# Patient Record
Sex: Female | Born: 1980 | Race: White | Hispanic: No | Marital: Married | Smoking: Former smoker
Health system: Southern US, Community
[De-identification: ages and names within clinical notes are randomized; demographics above are authoritative.]

## PROBLEM LIST (undated history)

## (undated) DIAGNOSIS — E282 Polycystic ovarian syndrome: Secondary | ICD-10-CM

## (undated) HISTORY — PX: TONSILLECTOMY: SUR1361

---
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
## 2000-07-07 ENCOUNTER — Inpatient Hospital Stay (HOSPITAL_COMMUNITY): Admission: AD | Admit: 2000-07-07 | Discharge: 2000-07-07 | Payer: Self-pay | Admitting: Obstetrics

## 2002-10-20 ENCOUNTER — Encounter: Admission: RE | Admit: 2002-10-20 | Discharge: 2002-10-20 | Payer: Self-pay | Admitting: Family Medicine

## 2002-10-20 ENCOUNTER — Encounter: Payer: Self-pay | Admitting: Family Medicine

## 2003-03-20 ENCOUNTER — Other Ambulatory Visit: Admission: RE | Admit: 2003-03-20 | Discharge: 2003-03-20 | Payer: Self-pay | Admitting: Gynecology

## 2004-03-20 ENCOUNTER — Other Ambulatory Visit: Admission: RE | Admit: 2004-03-20 | Discharge: 2004-03-20 | Payer: Self-pay | Admitting: Gynecology

## 2005-04-01 ENCOUNTER — Other Ambulatory Visit: Admission: RE | Admit: 2005-04-01 | Discharge: 2005-04-01 | Payer: Self-pay | Admitting: Gynecology

## 2005-11-25 ENCOUNTER — Inpatient Hospital Stay (HOSPITAL_COMMUNITY): Admission: AD | Admit: 2005-11-25 | Discharge: 2005-11-29 | Payer: Self-pay | Admitting: Obstetrics and Gynecology

## 2006-01-07 ENCOUNTER — Other Ambulatory Visit: Admission: RE | Admit: 2006-01-07 | Discharge: 2006-01-07 | Payer: Self-pay | Admitting: Obstetrics and Gynecology

## 2007-01-07 ENCOUNTER — Ambulatory Visit: Payer: Self-pay | Admitting: Cardiology

## 2007-07-20 ENCOUNTER — Encounter: Admission: RE | Admit: 2007-07-20 | Discharge: 2007-07-20 | Payer: Self-pay | Admitting: Emergency Medicine

## 2007-11-02 ENCOUNTER — Encounter: Admission: RE | Admit: 2007-11-02 | Discharge: 2007-11-02 | Payer: Self-pay | Admitting: Obstetrics and Gynecology

## 2008-04-19 ENCOUNTER — Encounter: Admission: RE | Admit: 2008-04-19 | Discharge: 2008-04-19 | Payer: Self-pay | Admitting: Family Medicine

## 2008-05-21 ENCOUNTER — Ambulatory Visit (HOSPITAL_COMMUNITY): Admission: RE | Admit: 2008-05-21 | Discharge: 2008-05-21 | Payer: Self-pay | Admitting: Obstetrics and Gynecology

## 2008-08-02 ENCOUNTER — Ambulatory Visit (HOSPITAL_COMMUNITY): Admission: RE | Admit: 2008-08-02 | Discharge: 2008-08-02 | Payer: Self-pay | Admitting: Obstetrics and Gynecology

## 2008-12-31 ENCOUNTER — Inpatient Hospital Stay (HOSPITAL_COMMUNITY): Admission: AD | Admit: 2008-12-31 | Discharge: 2008-12-31 | Payer: Self-pay | Admitting: Obstetrics and Gynecology

## 2009-01-04 ENCOUNTER — Encounter (INDEPENDENT_AMBULATORY_CARE_PROVIDER_SITE_OTHER): Payer: Self-pay | Admitting: Obstetrics and Gynecology

## 2009-01-04 ENCOUNTER — Inpatient Hospital Stay (HOSPITAL_COMMUNITY): Admission: AD | Admit: 2009-01-04 | Discharge: 2009-01-07 | Payer: Self-pay | Admitting: Obstetrics and Gynecology

## 2010-07-01 IMAGING — US US OB COMP +14 WK
1 series · 14 of 28 positions shown · non-contrast
Comparison: none

OBSTETRICAL ULTRASOUND:
 This ultrasound was performed in The [HOSPITAL], and the [facility] report will be stored to [REDACTED] PACS.

[Series 1: us ob comp +14 wk · 14 of 120 slices shown]
[im 5/120]
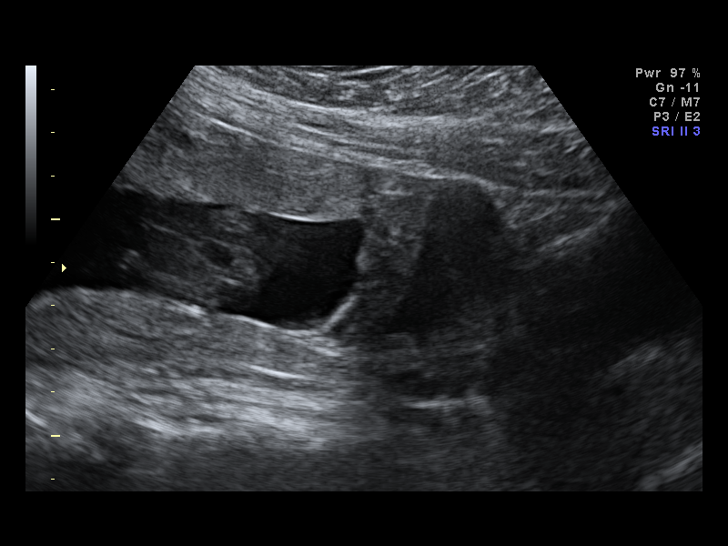
[im 14/120]
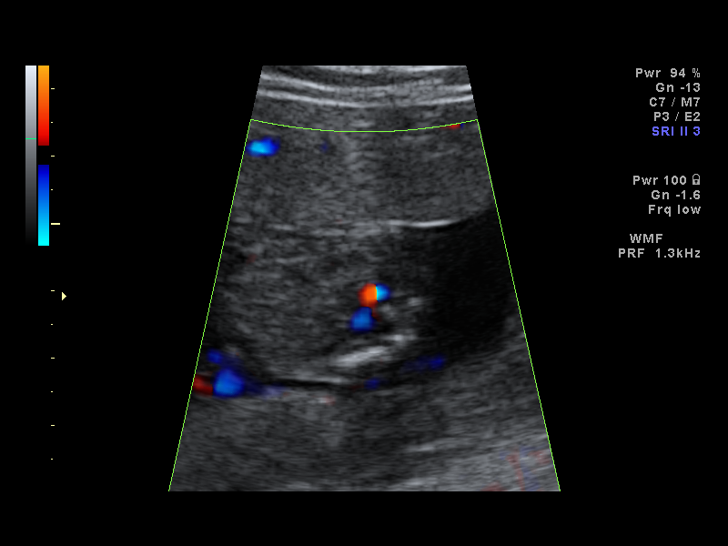
[im 23/120]
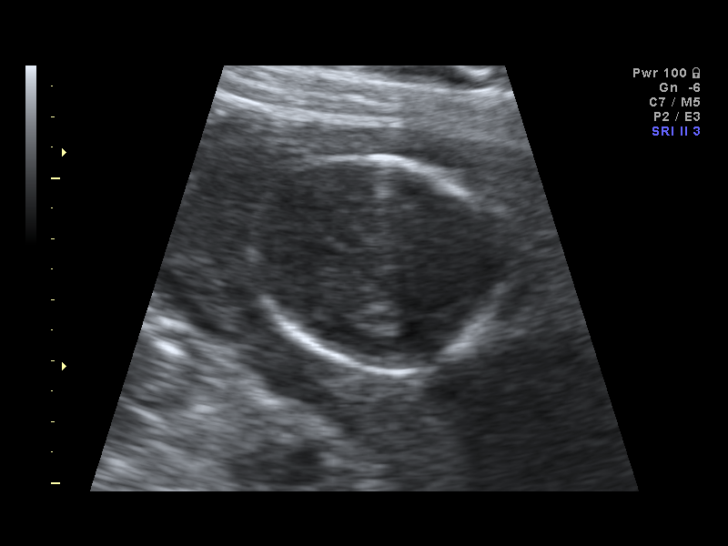
[im 31/120]
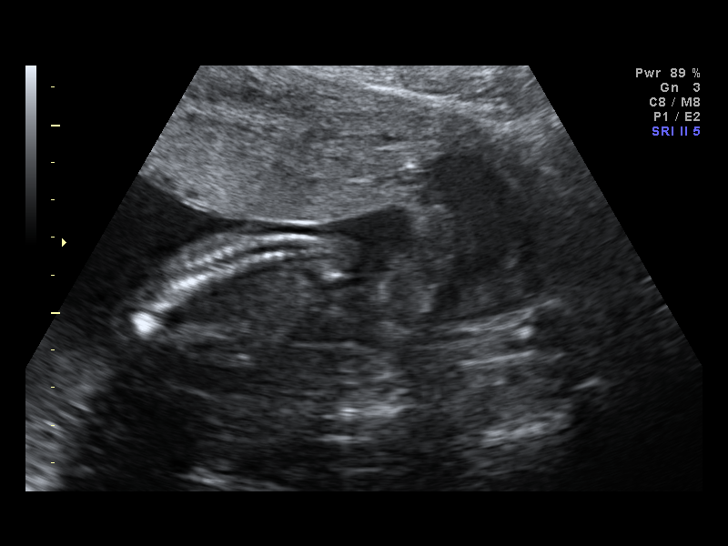
[im 40/120]
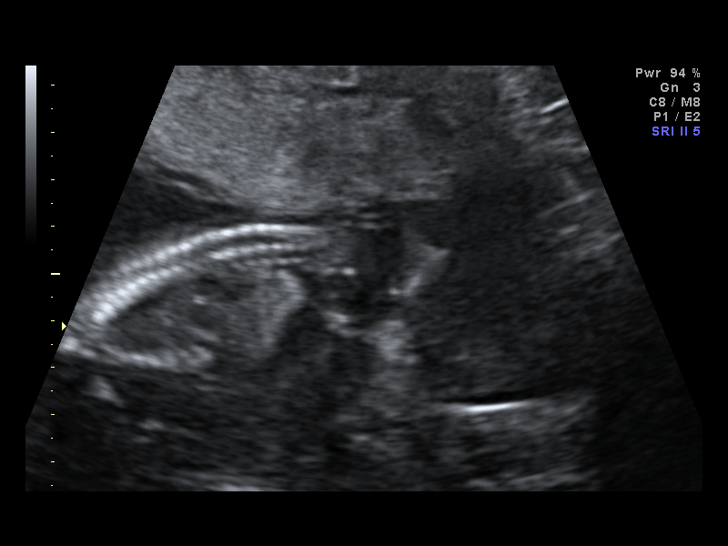
[im 49/120]
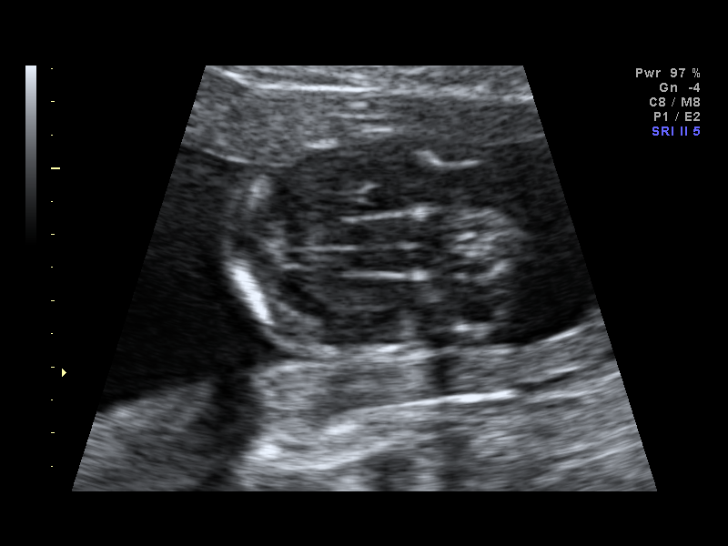
[im 58/120]
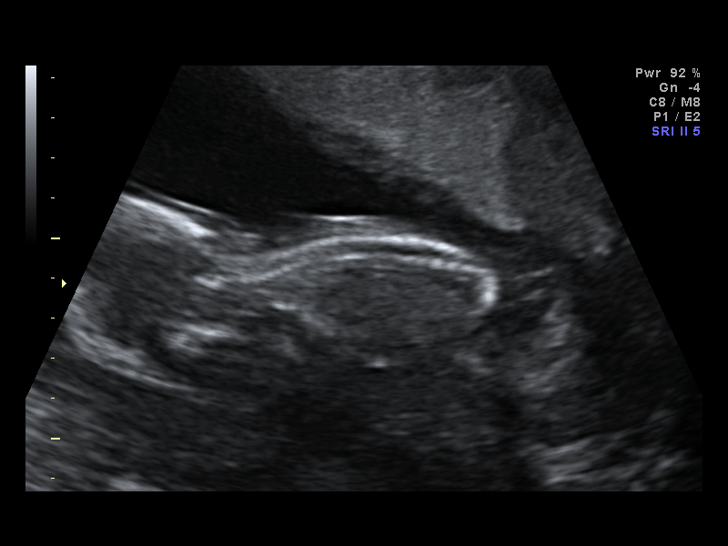
[im 67/120]
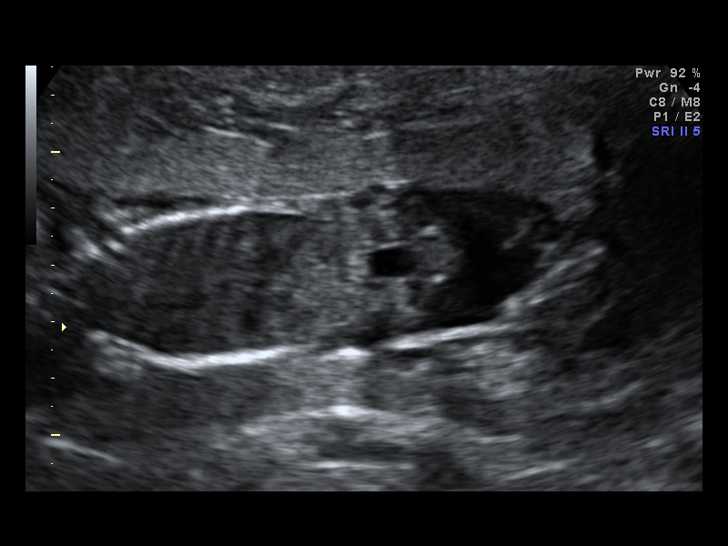
[im 75/120]
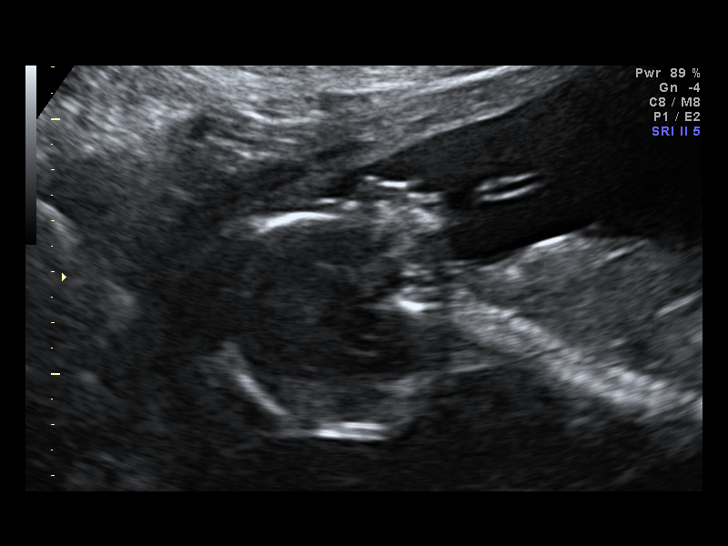
[im 84/120]
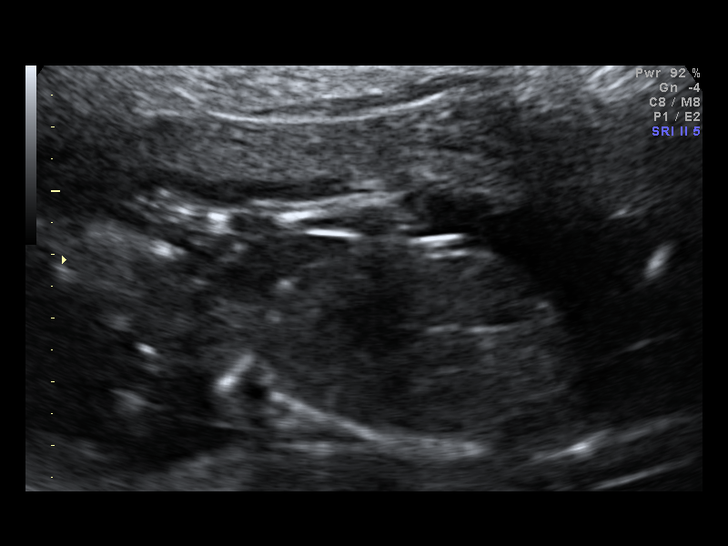
[im 93/120]
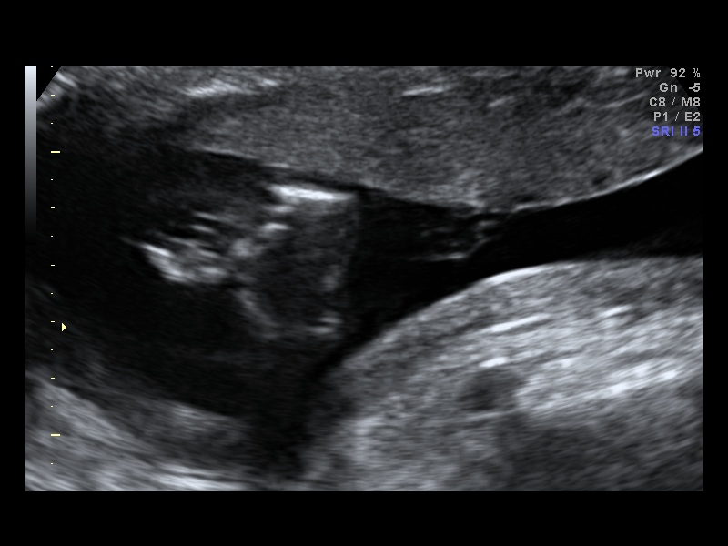
[im 102/120]
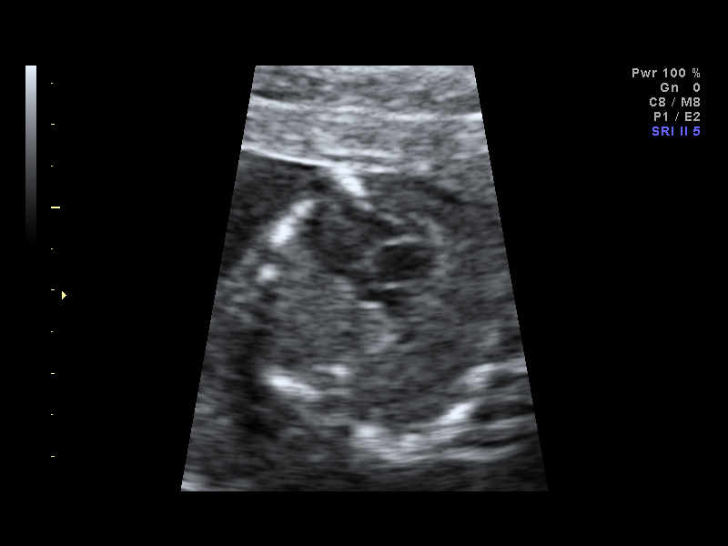
[im 111/120]
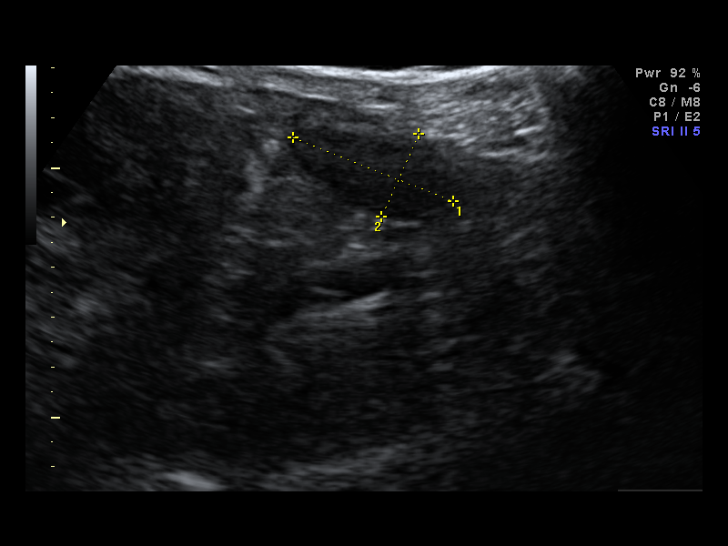
[im 120/120]
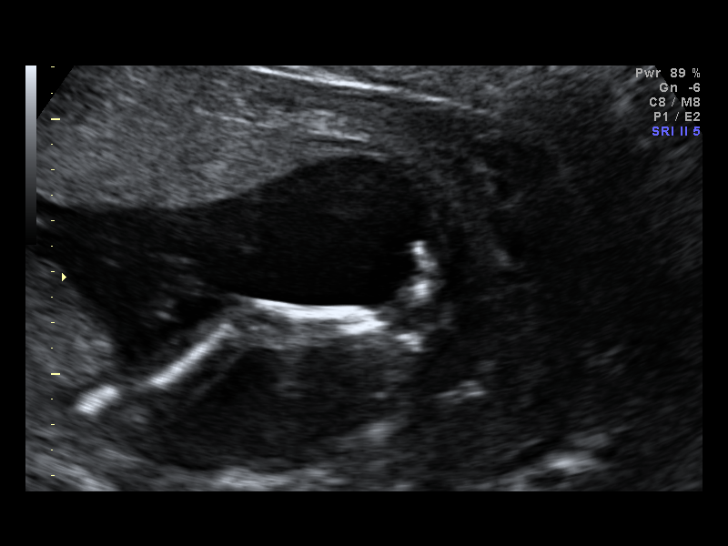

[14 of 28 positions shown; findings below may reference images not displayed]

IMPRESSION: [facility] has also been faxed to the ordering physician.

## 2011-01-07 LAB — CBC
HCT: 23.8 % — ABNORMAL LOW (ref 36.0–46.0)
HCT: 31.3 % — ABNORMAL LOW (ref 36.0–46.0)
Hemoglobin: 10.5 g/dL — ABNORMAL LOW (ref 12.0–15.0)
MCHC: 33.6 g/dL (ref 30.0–36.0)
MCHC: 33.6 g/dL (ref 30.0–36.0)
MCV: 75.6 fL — ABNORMAL LOW (ref 78.0–100.0)
MCV: 76.5 fL — ABNORMAL LOW (ref 78.0–100.0)
Platelets: 272 10*3/uL (ref 150–400)
RDW: 15.1 % (ref 11.5–15.5)

## 2011-01-07 LAB — CCBB MATERNAL DONOR DRAW

## 2011-02-10 NOTE — Discharge Summary (Signed)
Alisha Hansen, Alisha Hansen            ACCOUNT NO.:  [id number]   MEDICAL RECORD NO.:  [id number]          PATIENT TYPE:  INP   LOCATION:  9124                          FACILITY:  [facility]   PHYSICIAN:  Dineen Kid. Rana Snare, M.D.    DATE OF BIRTH:  1980-10-18   DATE OF ADMISSION:  01/04/2009  DATE OF DISCHARGE:  01/07/2009                               DISCHARGE SUMMARY   ADMITTING DIAGNOSES:  1. Intrauterine pregnancy at 38-6/7th weeks estimated gestational age.  2. Previous cesarean section, desires repeat.  3. Spontaneous onset of labor.   DISCHARGE DIAGNOSES:  1. Status post low transverse cesarean section.  2. A viable female infant.   PROCEDURES:  1. Repeat low transverse cesarean section.  2. Resection of vulvar nevus.   REASON FOR ADMISSION:  Please see written H and P.   HOSPITAL COURSE:  The patient is a 30 year old, white, married female,  gravida 2, para 1, who was admitted to [facility] at 63-  6/7th weeks estimated gestational age in labor.  The patient had had a  previous cesarean section, desired repeat.  The patient was then  transferred to the operating room where spinal anesthesia was  administered without difficulty.  A low-transverse incision was made  with delivery of a viable female infant weighing 9 pounds 6 ounces with  Apgars of 8 at one and 9 at five minutes.  The patient tolerated the  procedure well and taken to the recovery room in stable condition.  On  postoperative day #1, the patient was without complaint.  Vital signs  remained stable.  She was afebrile.  Fundus firm and nontender.  Abdominal dressing noted to be clean, dry, and intact.  Laboratory  findings showed hemoglobin of 8.0.  On postoperative day #2, the patient  was without complaint.  Vital signs were stable.  She was afebrile.  Fundus firm and nontender.  Abdominal dressing had been removed  revealing an incision that was clean, dry, and intact.  On postoperative  day #3, the  patient was without complaint.  Vital signs were stable.  She was afebrile.  Fundus firm and nontender.  Incision was clean, dry,  and intact.  Staples removed.  The patient later discharged home.   CONDITION ON DISCHARGE:  Stable.   DIET:  Regular as tolerated.   ACTIVITY:  No heavy lifting, no driving x2 weeks, no vaginal entry.   FOLLOWUP:  The patient is to follow up in the office in 1-2 weeks for an  incision check.  She is to call for temperature greater than 100  degrees, persistent nausea, vomiting, heavy vaginal bleeding, and/or  redness or drainage from the incisional site.   DISCHARGE MEDICATIONS:  1. Tylox #30 one p.o. 4-6 hours p.r.n.  2. Motrin 600 mg every 6 hours.  3. Prenatal vitamins 1 p.o. daily.  4. Colace 1 p.o. daily p.r.n.      Julio Sicks, N.P.      Dineen Kid Rana Snare, M.D.  Electronically Signed    CC/MEDQ  D:  01/07/2009  T:  01/08/2009  Job:  [id number]

## 2011-02-10 NOTE — Op Note (Signed)
Alisha Hansen, Alisha Hansen            ACCOUNT NO.:  [id number]   MEDICAL RECORD NO.:  [id number]          PATIENT TYPE:  INP   LOCATION:  9124                          FACILITY:  [facility]   PHYSICIAN:  Guy Sandifer. Henderson Cloud, M.D. DATE OF BIRTH:  [birth year]   DATE OF PROCEDURE:  01/04/2009  DATE OF DISCHARGE:                               OPERATIVE REPORT   PREOPERATIVE DIAGNOSES:  1. Previous cesarean section, desires repeat.  2. Labor.   POSTOPERATIVE DIAGNOSES:  1. Previous cesarean section, desires repeat.  2. Labor.   PROCEDURE:  1. Repeat low transverse cesarean section.  2. Resection of vulvar nevus.   SURGEON:  Guy Sandifer. Henderson Cloud, M.D.   ANESTHESIA:  Spinal.   SPECIMENS:  1. Placenta.  2. Vulvar nevus sent off to pathology.   FINDINGS:  Viable female infant, Apgars of 8 and 9 at one and five minutes  respectively.  Birth weight 9 pounds 8 ounces.  Arterial cord pH 7.17.  Estimated blood loss 800 cc.   INDICATIONS AND CONSENT:  This patient is a 30 year old married white  female G2, P30, EDC of January 12, 2009.  Prenatal care has been  complicated by previous cesarean section.  The patient desires repeat.  The patient presents with regular uterine contractions every 3 minutes.  Cesarean section was discussed with the patient and her husband.  Potential risks and complications were reviewed preoperatively including  but limited to infection, organ damage, bleeding requiring transfusion  of blood products with HIV and hepatitis acquisition, DVT, PE,  pneumonia.  All questions were answered and consent was signed on the  chart.  The patient also requests resection of vulvar nevus.  Pain and  dyspareunia following this have been reviewed.   PROCEDURE:  The patient is taken to the operating room where she was  identified, spinal anesthetic was placed and she was placed in the  dorsal supine position with a 15 degree left lateral wedge.  She was  then prepped.  Foley catheter was  placed, the bladder was drained and  she was draped in a sterile fashion.  Time-out was carried out.  After  testing for adequate spinal anesthesia, skin is entered through the  Pfannenstiel scar and resection of the old scar and the vein was done.  Dissection was carried out to the peritoneum which was incised and  extended superiorly and inferiorly.  Vesicouterine peritoneum was taken  down cephalad laterally.  The bladder flap was developed.  The bladder  blade was placed.  Uterus was incised in a low transverse manner.  The  uterine cavity was entered bluntly with a hemostat.  Uterine incision  was extended cephalad laterally with fingers.  Artificial rupture of  membranes for copious clear fluid was carried out.  Vacuum extractor was  used to elevate the vertex from the incision.  This was done with a  single gentle extraction with no pop offs.  The vacuum extractor was  removed.  Oral and nasal pharynx were suctioned.  Nuchal cord x1 was  reduced.  Remainder of the baby was delivered.  Good cry and tone was  noted.  Cord was clamped and cut.  The baby was handed to the waiting  pediatrics team.  Placenta was manually delivered and sent to pathology.  Uterine cavity was clean.  Uterus was closed in a running locking  fashion of 2 imbricating layers of 0 Monocryl suture.  Additional figure-  of-eight of 0 Monocryl was placed on the center of the incision to  obtain complete hemostasis.  Careful observation reveals good  hemostasis.  Ovaries palpated normally bilaterally.  Anterior peritoneum  was closed in a running fashion with 0 Monocryl suture which was also  used to reapproximate the  pyramidalis muscle in the midline.  Anterior  rectus fascia is closed in a running fashion with the double looped 0  PDS suture.  Subcutaneous layers closed with interrupted 2-0 plain  sutures and the skin was closed with clips.  All sponge, instrument, and  needle counts were correct and the patient  was transferred to the  recovery room in stable condition.      Guy Sandifer Henderson Cloud, M.D.  Electronically Signed     JET/MEDQ  D:  01/04/2009  T:  01/05/2009  Job:  [id number]

## 2011-02-13 NOTE — Discharge Summary (Signed)
Alisha Hansen, Alisha Hansen            ACCOUNT NO.:  [id number]   MEDICAL RECORD NO.:  [id number]          PATIENT TYPE:  INP   LOCATION:  9123                          [facility]ACILITY:  [facility]   PHYSICIAN:  Freddy Finner, M.D.   DATE OF BIRTH:  14-Jun-1981   DATE OF ADMISSION:  11/25/2005  DATE OF DISCHARGE:  11/29/2005                                 DISCHARGE SUMMARY   ADMITTING DIAGNOSIS:  1.  Intrauterine pregnancy at 28 weeks estimated gestational age.  2.  Two-stage induction of labor secondary to large-for-gestational age      infant.   DISCHARGE DIAGNOSIS:  1.  Status post low transverse cesarean section secondary to failure to      progress.  2.  Viable female infant.   PROCEDURE:  Primary low transverse cesarean section.   REASON FOR ADMISSION:  Please see written H&P.   HOSPITAL COURSE:  Patient was a 30 year old primigravida that was admitted  to [facility] at 39 weeks estimated gestational age for a  two-stage induction due to large-for-gestational age infant.  Patient did  progress to 3 cm dilated, 50% effaced, vertex at -3 station.  Despite  adequate labor for greater than four hours patient made no further cervical  change beyond 3 cm dilatation.  Decision was made to proceed with a low  transverse cesarean section.  Patient was then transferred to the operating  room where epidural was dosed to an adequate surgical level.  A low  transverse incision was made with delivery of a viable female infant  weighing 8 pounds 0 ounces with Apgars of 9 at one minute, 10 at five  minutes.  The patient tolerated procedure well and was taken to the recovery  room in stable condition.  On postoperative day #1 patient was without  complaint.  Vital signs were stable.  She was afebrile.  Abdomen soft.  Fundus firm and nontender.  Abdominal dressing was noted to have a scant  amount of drainage.  On postoperative day #2 patient was doing well.  Vital  signs were stable.   She was afebrile.  Fundus firm and nontender.  Abdominal  dressing had been removed revealing an incision that was clean, dry, and  intact.  Laboratory findings revealed hemoglobin of 10.1.  On postoperative  day #3 patient was without complaint.  Vital signs remained stable.  She was  afebrile.  Fundus firm and nontender.  Incision was clean, dry, and intact.  Patient is ambulating well, tolerating a regular diet without complaints of  nausea and vomiting.  Staples were removed.  Steri-Strips were applied and  discharge instructions reviewed and patient was later discharged home.   CONDITION ON DISCHARGE:  Stable.   DIET:  Regular, as tolerated.   ACTIVITY:  No heavy lifting, no driving x2 weeks, no vaginal entry.   FOLLOW-UP:  Patient to follow up in the office in one to two weeks for an  incision check.  She is to call for temperature greater than 100 degrees,  persistent nausea, vomiting, heavy vaginal bleeding and/or redness or  drainage from the incisional site.  DISCHARGE MEDICATIONS:  1.  Percocet 5/325 #30 one p.o. every four to six hours p.r.n.  2.  Motrin 600 mg every six hours.  3.  Prenatal vitamins one p.o. daily.  4.  Colace one p.o. daily p.r.n.      Julio Sicks, N.P.      Freddy Finner, M.D.  Electronically Signed    CC/MEDQ  D:  01/05/2006  T:  01/05/2006  Job:  [id number]

## 2011-02-13 NOTE — Op Note (Signed)
NAMEXITLALY, AULT            ACCOUNT NO.:  [id number]   MEDICAL RECORD NO.:  [id number]          PATIENT TYPE:  INP   LOCATION:  9164                          [facility]ACILITY[facility]   PHYSICIAN:  Dineen Kid. Rana Snare, M.D.    DATE OF BIRTH:  05-13-1981   DATE OF PROCEDURE:  11/26/2005  DATE OF DISCHARGE:                                 OPERATIVE REPORT   PREOPERATIVE DIAGNOSES:  1.  Intrauterine pregnancy at 39 weeks.  2.  Failure to progress.   POSTOPERATIVE DIAGNOSES:  1.  Intrauterine pregnancy at 39 weeks.  2.  Failure to progress.   OPERATION/PROCEDURE:  Primary low segment transverse cesarean section.   SURGEON:  Dineen Kid. Rana Snare, M.D.   ANESTHESIA:  Epidural.   INDICATIONS:  Alisha Hansen is a 30 year old who presented at 39 weeks for two-  stage induction due to large-for-gestational-age infant.  She progressed to  3 cm, 50% effaced, fetal vertex 50% into the pelvis adequately, but despite  adequate labor for greater than four hours, no fetal cervical change beyond  3 cm, 50% was made and we proceeded with primary low segment transverse  cesarean section for failure to progress.   FINDINGS AT THE TIME OF SURGERY:  Viable female infant, Apgars 9 and 10,  weight is 8 pounds 0 ounces.   DESCRIPTION OF PROCEDURE:  After adequate analgesia, the patient was placed  in the supine position, left lateral tilt.  She was sterilely prepped and  draped.  Bladder was sterilely drained with a Foley catheter.  A  Pfannenstiel skin incision was made two fingerbreadths above the pubic  symphysis taken down sharply to the fascia which was incised transversely,  extended superiorly and inferiorly off the bellies of the rectus muscle  which were separated sharply in the midline.  Peritoneum is entered sharply,  bladder flap was created, placed behind the bladder blade.  A low segment  myotomy incision was made down to the infant's vertex, extended laterally  with operator's fingertips. The infant's  vertex was delivered  atraumatically.  Nares and pharynx suctioned.  Infant was then delivered,  cord was clamped and cut, and infant was handed to the pediatricians for  resuscitation.  Placenta was extracted manually.  Uterus was exteriorized,  wiped clean with a dry lap.  The myotomy incision was closed in two layers,  the first layer being a running locking layer, the second being the  imbricating layer with 0 Monocryl suture.  Uterus was placed back into the  peritoneal cavity, and after a copious amount of irrigation, adequate  hemostasis was assured.  The peritoneum was closed with a 0 Monocryl.  Rectus muscle plicated in the midline.  Irrigation was applied and after  adequate hemostasis, the fascia was then closed with a #1 PDS in a running  fashion.  Irrigation was  applied and after adequate hemostasis, the skin was then stapled.  Steri-  Strips applied.  The patient tolerated the procedure well and was stable on  transfer to the recovery room.  Sponge and instrument count was normal x3.  The estimated blood loss was 600 mL.  The patient received 1 g of Rocephin  after delivery of the placenta.      Dineen Kid Rana Snare, M.D.  Electronically Signed     DCL/MEDQ  D:  11/26/2005  T:  11/27/2005  Job:  [id number]

## 2011-02-13 NOTE — Assessment & Plan Note (Signed)
[facility]                            CARDIOLOGY OFFICE NOTE   NAME:RIEDELCliffie, Gingras                     MRN:          [id number]  DATE:01/07/2007                            DOB:          1981-08-03    I was asked by Dr. Thressa Sheller to evaluate Alisha Hansen, a  delightful 30 year old married white female with cardiac risk factors  including mixed hyperlipidemia.   She walks about 3 to 5 hours a week.  She does get a little short of  breath when she pushes it too hard.  She had 1 episode of chest cramping  that was fairly brief.  She has had no other symptoms of ischemia or any  other symptoms to suggest ischemia.   Her cardiac risk factors include obesity, tobacco use, which she quit  March 31, 2006 when she found out she was pregnant with her 1st child, who  is now less than 55 year old.  She has mixed hyperlipidemia with a total  cholesterol of 227, triglycerides of 167, HDL of 40, LDL of 154, VLDL of  33.   There is no premature history of coronary disease.  She does not have  hypertension or diabetes.  Her father is diabetic, however, and is  overweight.  We talked about this at length.   PAST MEDICAL HISTORY:  SHE HAS NO KNOWN DRUG ALLERGIES.   She is on once-a-day multivitamins.   She does not smoke.  She does have about 6 alcoholic beverages per  weekend.  She drinks a couple of cups of coffee a day.   She has had a C-section, tonsillectomy, and wisdom tooth extraction.   FAMILY HISTORY:  As above.   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  She is married and has 1  child, as mentioned above.   REVIEW OF SYSTEMS:  Negative for orthopnea, PND, peripheral edema.  She  has had no tachy palpitations.  There is no history of syncope.   EXAMINATION:  Her blood pressure is 106/72.  Her pulse is 78 and  regular.  She is 5 feet 2, weighs 210.  HEENT:   Normocephalic and atraumatic.  PERRLA.  Extraocular movements  intact.  Sclerae are clear.  Facial symmetry is normal.  Dentition is  satisfactory.  NECK:  Supple.  Carotid upstrokes are equal bilaterally without bruits.  There is no JVD.  Thyroid is not enlarged.  Trachea is midline.  LUNGS:  Clear to auscultation.  HEART:  Reveals a poorly appreciated PMI.  She has normal S1 and S2.  There is no gallop.  ABDOMINAL EXAM:  Soft with good bowel sounds.  Organomegaly could not be  assessed.  EXTREMITIES:  No cyanosis, clubbing, or edema.  Pulses are present.  NEUROLOGIC EXAM:  Intact.   EKG is normal.   ASSESSMENT AND PLAN:  I spent about 20 minutes plus talking to Ms.  Secrist about cardiovascular risk factor modification and TLC, or  therapeutic lifestyle changes.  I made the following recommendations:  1. Continue  walking 3 to 5 hours per week, as she is doing.  This will      lower her cardiovascular risk by 15% to 20% per year.  It will also      neutralize the obesity factor based on recent studies.  2. Try to maintain her weight, certainly not gain more, and maybe lose      more.  3. Do not restart smoking.  4. Annual blood work including fasting blood sugar and lipids.   I have not recommended any pharmacological therapy.  Her youth is  forgiving, as I told her today.  This may change down the road.     Thomas C. Daleen Squibb, MD, [facility]  Electronically Signed    TCW/MedQ  DD: 01/07/2007  DT: 01/07/2007  Job #: [id number]   cc:   Guy Sandifer. Henderson Cloud, M.D.

## 2011-08-04 ENCOUNTER — Other Ambulatory Visit: Payer: Self-pay | Admitting: Gynecology

## 2012-11-01 ENCOUNTER — Other Ambulatory Visit: Payer: Self-pay | Admitting: Gynecology

## 2015-02-13 DIAGNOSIS — E66812 Obesity, class 2: Secondary | ICD-10-CM | POA: Insufficient documentation

## 2015-02-13 DIAGNOSIS — E282 Polycystic ovarian syndrome: Secondary | ICD-10-CM | POA: Insufficient documentation

## 2017-02-05 ENCOUNTER — Ambulatory Visit (INDEPENDENT_AMBULATORY_CARE_PROVIDER_SITE_OTHER): Payer: Self-pay

## 2017-02-05 ENCOUNTER — Ambulatory Visit (HOSPITAL_COMMUNITY)
Admission: EM | Admit: 2017-02-05 | Discharge: 2017-02-05 | Disposition: A | Discharge status: Home / Self Care | Payer: Self-pay | Attending: Internal Medicine | Admitting: Internal Medicine

## 2017-02-05 ENCOUNTER — Encounter (HOSPITAL_COMMUNITY): Payer: Self-pay | Admitting: Emergency Medicine

## 2017-02-05 DIAGNOSIS — S93401A Sprain of unspecified ligament of right ankle, initial encounter: Secondary | ICD-10-CM

## 2017-02-05 DIAGNOSIS — M25571 Pain in right ankle and joints of right foot: Secondary | ICD-10-CM

## 2017-02-05 HISTORY — DX: Polycystic ovarian syndrome: E28.2

## 2017-02-05 MED ORDER — IBUPROFEN 800 MG PO TABS: 800.0000 mg | ORAL_TABLET | Freq: Three times a day (TID) | ORAL | 0 refills | Status: DC

## 2017-02-05 NOTE — Discharge Instructions (Signed)
No fracture, or dislocation on x-ray. Most likely a sprain, I recommend rest, ice 15 minutes at a time up to 4 times a day, compression of the joint through use of Ace bandages, and elevation whenever possible. For pain, prescribed ibuprofen 800 mg, take one tablet every 8 hours. If pain persists past 2 weeks, follow up with an orthopedist.

## 2017-02-05 NOTE — ED Provider Notes (Signed)
CSN: [id number]     Arrival date & time 02/05/17  1013 History   First MD Initiated Contact with Patient 02/05/17 1129     Chief Complaint  Patient presents with  . Ankle Pain   (Consider location/radiation/quality/duration/timing/severity/associated sxs/prior Treatment) 36 year old female presents to clinic for evaluation of right ankle pain. She reports that 3 days ago she stepped off the curb causing her to fall and "rolled her ankle" stay for first 2 days she had significant pain with walking, however has improved some. States she has bruising, swelling around the ankle, and a "clicking" sensation when she walks. She has tried over-the-counter therapies with some relief and wrap the ankle. Pain does not radiate, she has no past history that is significant.   The history is provided by the patient.  Ankle Pain  Associated symptoms: no back pain     Past Medical History:  Diagnosis Date  . PCOS (polycystic ovarian syndrome)    History reviewed. No pertinent surgical history. History reviewed. No pertinent family history. Social History  Substance Use Topics  . Smoking status: Never Smoker  . Smokeless tobacco: Never Used  . Alcohol use Yes   OB History    No data available     Review of Systems  Constitutional: Negative.   Respiratory: Negative.   Cardiovascular: Negative.   Gastrointestinal: Negative.   Musculoskeletal: Positive for joint swelling. Negative for back pain.  Skin: Positive for color change.  Neurological: Negative.     Allergies  Patient has no known allergies.  Home Medications   Prior to Admission medications   Not on File   Meds Ordered and Administered this Visit  Medications - No data to display  BP 115/63 (BP Location: Right Arm)   Pulse 65   Temp 98.1 F (36.7 C) (Oral)   Resp 18   LMP 01/19/2017 (Approximate)   SpO2 100%  No data found.   Physical Exam  Constitutional: She is oriented to person, place, and time. She appears  well-developed and well-nourished. No distress.  HENT:  Head: Normocephalic and atraumatic.  Right Ear: External ear normal.  Left Ear: External ear normal.  Musculoskeletal: She exhibits tenderness. She exhibits no deformity.  Area of bruising on the lateral side of the foot over the metatarsals, swelling and tenderness over the medial malleolus, no pain or tenderness along the Achilles tendon. There is pain with rotation, flexion, and extension. Capillary refill less than 2 seconds, sensory function, and motor function remains intact.  Neurological: She is alert and oriented to person, place, and time.  Skin: Skin is warm and dry. Capillary refill takes less than 2 seconds. No rash noted. She is not diaphoretic. No erythema.  Psychiatric: She has a normal mood and affect. Her behavior is normal.  Nursing note and vitals reviewed.   Urgent Care Course     Procedures (including critical care time)  Labs Review Labs Reviewed - No data to display  Imaging Review Dg Ankle Complete Right  Result Date: 02/05/2017 CLINICAL DATA:  Stepped off curb yesterday twisting the ankle. Medial pain and swelling. EXAM: RIGHT ANKLE - COMPLETE 3+ VIEW COMPARISON:  None. FINDINGS: Pronounced medial soft tissue swelling. No evidence of fracture or dislocation. IMPRESSION: Pronounced medial soft tissue swelling. Electronically Signed   By: Paulina Fusi M.D.   On: 02/05/2017 11:51       MDM   1. Sprain of left ankle, unspecified ligament, initial encounter    No fracture or dislocation on x-ray,  most likely sprain, provided counseling on rice, given prescription for ibuprofen, recommend following up with orthopedics if pain persists past 2 weeks.    Dorena BodoKennard, Naureen Benton, NP 02/05/17 1214

## 2017-02-05 NOTE — ED Triage Notes (Signed)
[facility] a [facility] of right ankle pain and swelling secondary to stepping off of a curb 1 week ago.

## 2019-01-04 IMAGING — DX DG ANKLE COMPLETE 3+V*R*
3 series · 3 of 3 positions shown · non-contrast
Comparison: None.

CLINICAL DATA: Stepped off curb yesterday twisting the ankle.
Medial pain and swelling.

EXAM:
RIGHT ANKLE - COMPLETE 3+ VIEW

[ankle ap]
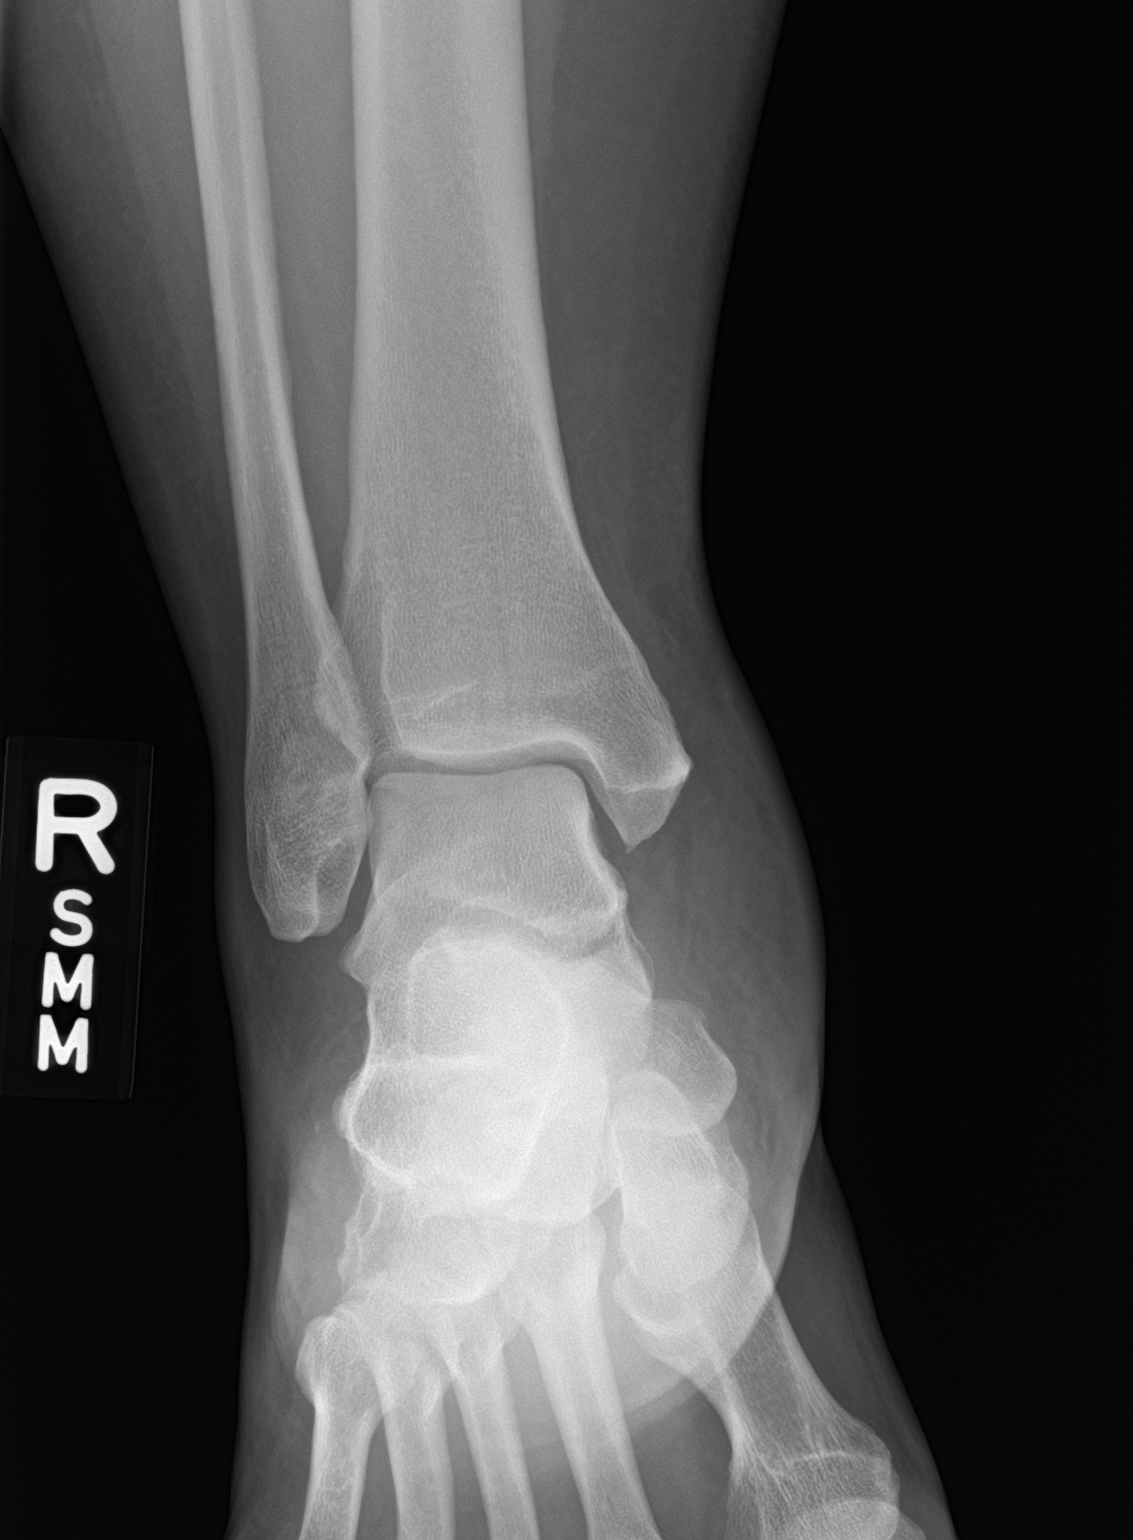

[ankle obl]
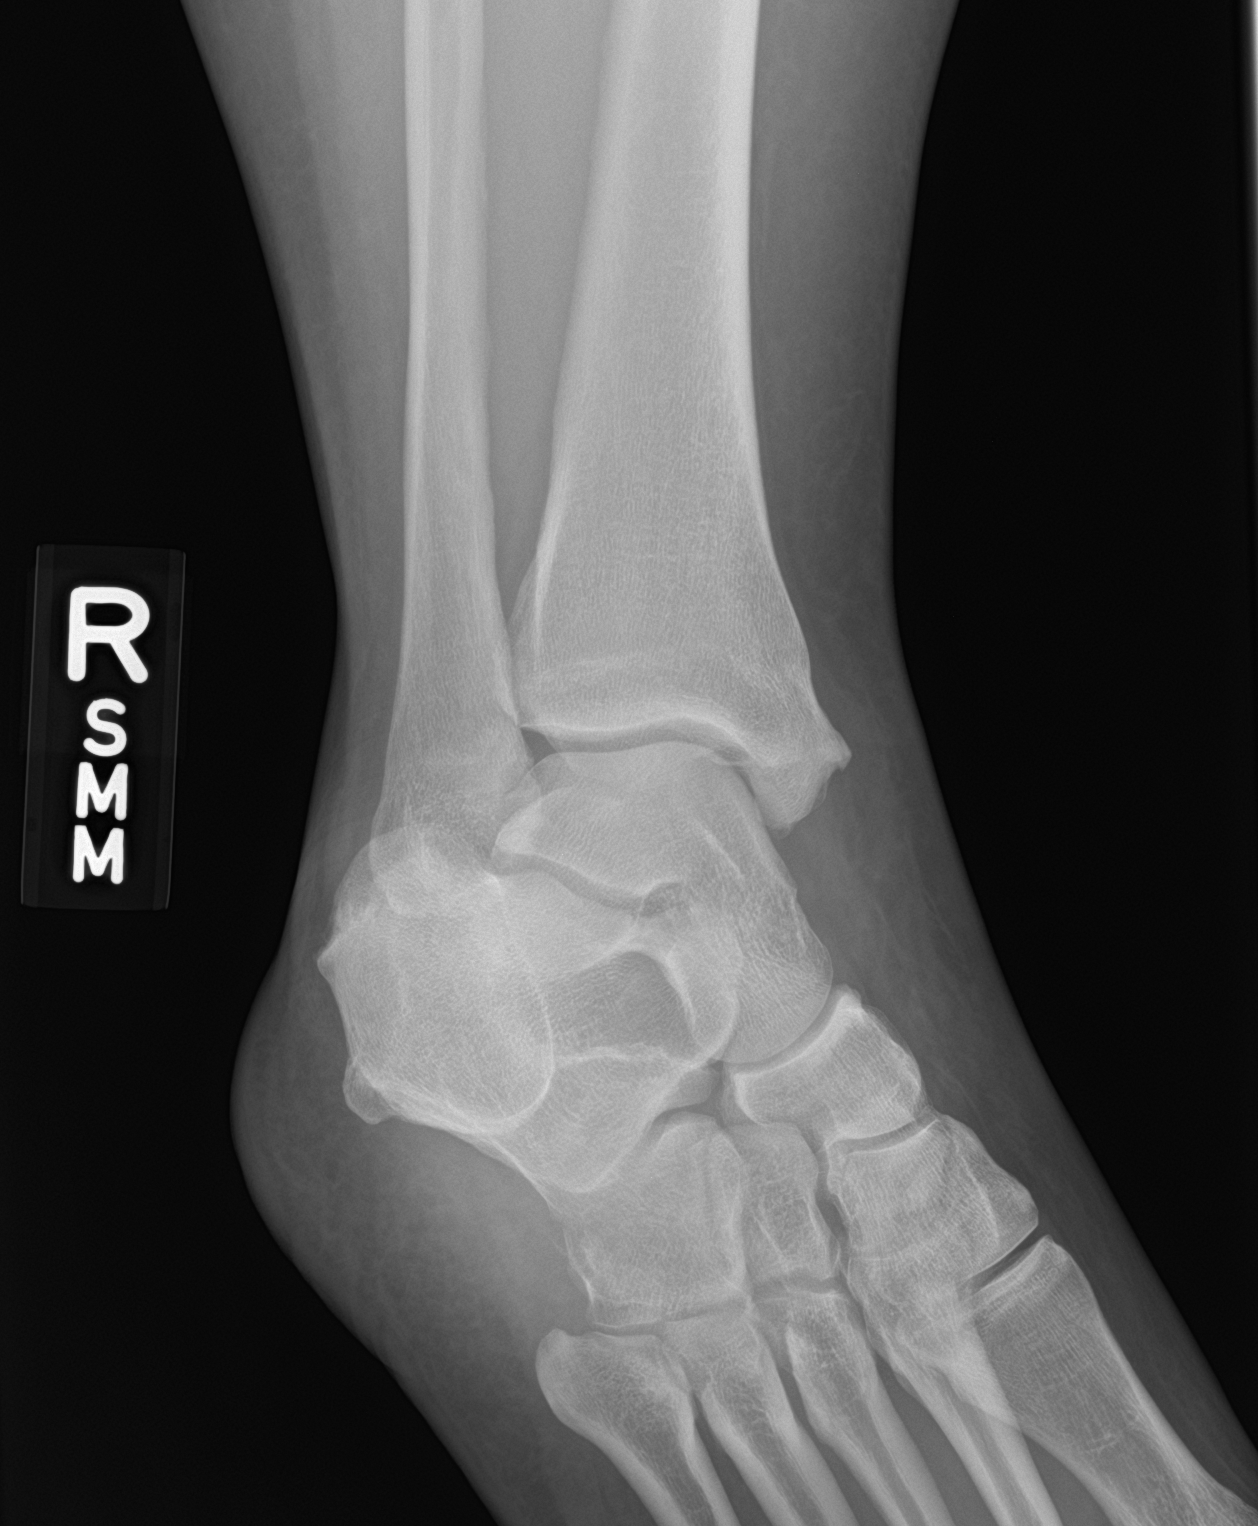

[ankle lat]
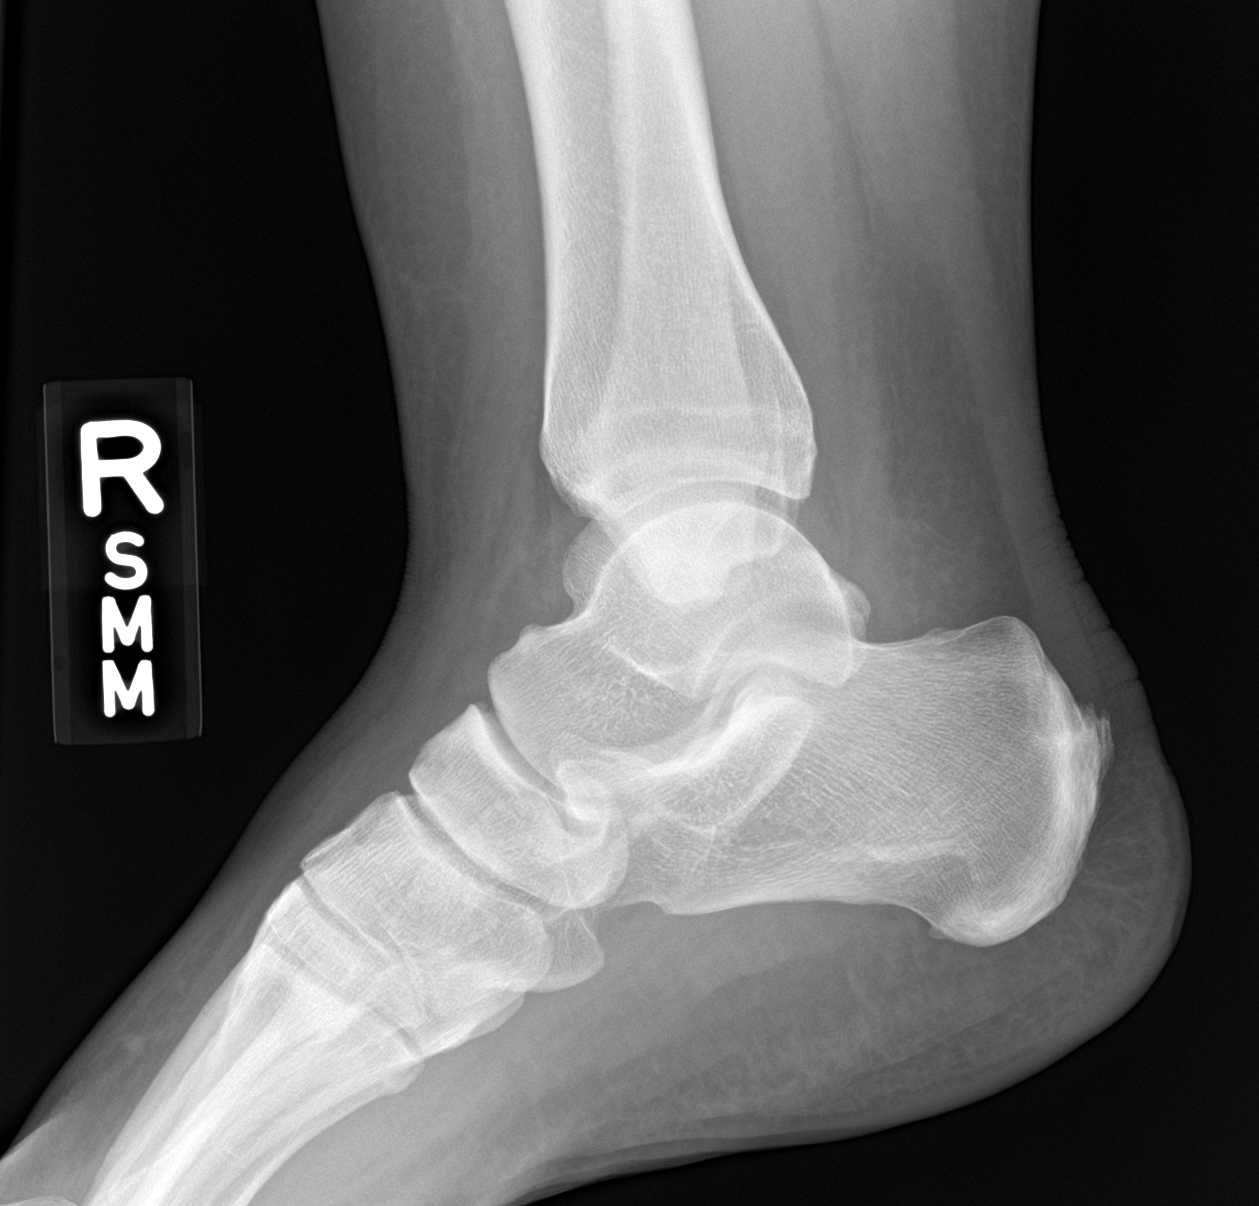

[3 of 3 positions shown; findings below may reference images not displayed]

FINDINGS: Pronounced medial soft tissue swelling. No evidence of fracture or
dislocation.
IMPRESSION: Pronounced medial soft tissue swelling.

## 2021-05-26 ENCOUNTER — Encounter: Payer: Self-pay | Admitting: *Deleted

## 2021-05-27 ENCOUNTER — Encounter: Payer: Self-pay | Admitting: Neurology

## 2021-05-27 ENCOUNTER — Ambulatory Visit (INDEPENDENT_AMBULATORY_CARE_PROVIDER_SITE_OTHER): Payer: 59 | Admitting: Neurology

## 2021-05-27 VITALS — BP 112/78 | HR 94 | Ht 62.5 in | Wt 217.0 lb

## 2021-05-27 DIAGNOSIS — G250 Essential tremor: Secondary | ICD-10-CM | POA: Diagnosis not present

## 2021-05-27 DIAGNOSIS — Z820 Family history of epilepsy and other diseases of the nervous system: Secondary | ICD-10-CM | POA: Diagnosis not present

## 2021-05-27 NOTE — Progress Notes (Addendum)
Subjective:    Patient ID: Alisha Hansen is a 40 y.o. female.  HPI    Star Age, MD, PhD [facility] [address] [place], [place] [address]  Dear Dr. Theda Sers,   I saw your patient, Alisha Hansen, upon your kind request in my neurologic clinic today for initial consultation of her tremors.  The patient is accompanied by her mother today.  As you know, [name] is a 40 year old right-handed woman with an underlying medical history of Vitamin D deficiency, anxiety, PCOS, and obesity, who reports a intermittent head tremor for the past few years.  The tremor is at times bothersome, typically more noticeable when she is sleep deprived or dehydrated or nervous/stressed.  She does have anxiety and has a prescription for buspirone for it.  She does not typically have to take it on a day-to-day basis.  She is currently not on any antidepressant medication.  She got more worried about her tremor because her father passed away last year in 12-16-19 from Parkinson's disease, he lived to be 40 years old.  She has a fairly strong family history on her mom side of tremors.  Mom has a hand and head tremor, maternal grandmother had a tremor and great grandfather also had a tremor on mom side.  She does not have much in the way of hand tremor.  She is typically not affected in her day-to-day functioning by the head tremor.  She did become more mindful of it when her father passed away.  She is worried about her family history of Parkinson's disease.  There is no other family history of Parkinson's disease as she recalls.  She is in the process of moving from [place] to [place].  Prior to that she lived in [place], [place].  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  She is married and lives with her husband and 2 children, ages 42 and 39 and 68 dog in the household.  She is typically well-hydrated.  She limits her caffeine to 1 serving per day on  average.  She does drink 1 alcoholic beverage per day on average, either mixed drink or wine or beer.  I reviewed your office note from 02/25/21.  She had blood work through your office on 02/28/2021 including CBC with differential, CMP, TSH, vitamin D, A1c, lipid panel.  We will request test results from your office.  She also reports that her father had a brain tumor, from her description, it was a pituitary adenoma.  Of note, she had a head CT with and without contrast on 04/19/2008 and I have reviewed the results: Impression: Normal examination. She reports no difficulty with coordination or handwriting or walking or balance.  Addendum, 06/04/2021: I reviewed blood test results from 02/28/2021: Hemoglobin A1c was 5.5, lipid panel showed total cholesterol of 193, triglycerides 133, HDL 32, LDL 134, TSH 2.04, vitamin D 37.6, CBC with differential unremarkable with the exception of platelets elevated at 462, absolute lymphocytes mildly elevated at 4.4, CMP with benign findings, alk phos 49, ALT 28, AST 19, BUN 6, creatinine 0.75.  Her Past Medical History Is Significant For: Past Medical History:  Diagnosis Date   PCOS (polycystic ovarian syndrome)     Her Past Surgical History Is Significant For: Past Surgical History:  Procedure Laterality Date   CESAREAN SECTION     12-15-2005 and December 15, 2008   TONSILLECTOMY      Her Family History Is Significant For: Family History  Problem Relation Age of Onset   Stroke Mother    Seizures Mother    Tremor Mother    Fibromyalgia Mother    Rheum arthritis Mother    Other Father        pituitary brain tumor, noncancerous   Parkinson's disease Father    Diabetes Father    Heart attack Father    Stroke Father    Tremor Maternal Grandmother    Stroke Maternal Grandmother    Dementia Maternal Grandmother    Fibromyalgia Maternal Grandmother    Pancreatic cancer Maternal Grandfather    Stroke Paternal Grandmother    Thyroid disease Paternal Grandmother    Heart  attack Paternal Grandfather    Tremor Maternal Great-grandmother    Dementia Maternal Great-grandfather    Stroke Maternal Great-grandfather     Her Social History Is Significant For: Social History   Socioeconomic History   Marital status: Married    Spouse name: Not on file   Number of children: 2   Years of education: Not on file   Highest education level: Not on file  Occupational History   Not on file  Tobacco Use   Smoking status: Former    Types: Cigarettes    Quit date: 2012    Years since quitting: 10.6   Smokeless tobacco: Never  Vaping Use   Vaping Use: Never used  Substance and Sexual Activity   Alcohol use: Yes    Alcohol/week: 7.0 standard drinks    Types: 7 Standard drinks or equivalent per week   Drug use: No   Sexual activity: Not on file  Other Topics Concern   Not on file  Social History Narrative   Lives at home with husband, children, and dog   Right handed   Caffeine: 1.5 cups/day   Social Determinants of Health   Financial Resource Strain: Not on file  Food Insecurity: Not on file  Transportation Needs: Not on file  Physical Activity: Not on file  Stress: Not on file  Social Connections: Not on file    Her Allergies Are:  No Known Allergies:   Her Current Medications Are:  Outpatient Encounter Medications as of 05/27/2021  Medication Sig   busPIRone (BUSPAR) 5 MG tablet as needed.   nitrofurantoin (MACRODANTIN) 100 MG capsule as needed.   [DISCONTINUED] ibuprofen (ADVIL,MOTRIN) 800 MG tablet Take 1 tablet (800 mg total) by mouth 3 (three) times daily.   No facility-administered encounter medications on file as of 05/27/2021.  :   Review of Systems:  Out of a complete 14 point review of systems, all are reviewed and negative with the exception of these symptoms as listed below:  Review of Systems  Neurological:        Patient is here with her mother. She seeks to be evaluated for parkinson's. She reports she has a tremor in her  head that is intermittent. She has a family history of Parkinson's on father's side and tremor on mother's side.    Objective:  Neurological Exam  Physical Exam Physical Examination:   Vitals:   05/27/21 1443  BP: 112/78  Pulse: 94    General Examination: The patient is a very pleasant 40 y.o. female in no acute distress. She appears well-developed and well-nourished and well groomed.   HEENT: Normocephalic, atraumatic, pupils are equal, round and reactive to light and accommodation. Funduscopic exam is normal with sharp disc margins noted. Extraocular tracking is good without limitation to gaze excursion or nystagmus noted. Normal smooth  pursuit is noted. Hearing is grossly intact. Face is symmetric with normal facial animation. Speech is clear with no dysarthria noted. There is no hypophonia. There is no lip, or jaw tremor, no voice tremor.  She has a subtle side to side head tremor which is intermittent.  Neck is supple with full range of motion, no carotid bruits.  Airway examination reveals mild mouth dryness.  Tongue protrudes centrally and palate elevates symmetrically, good tongue movements.   Chest: Clear to auscultation without wheezing, rhonchi or crackles noted.  Heart: S1+S2+0, regular and normal without murmurs, rubs or gallops noted.   Abdomen: Soft, non-tender and non-distended with normal bowel sounds appreciated on auscultation.  Extremities: There is no pitting edema in the distal lower extremities bilaterally. Pedal pulses are intact.  Skin: Warm and dry without trophic changes noted. There are no varicose veins.  Musculoskeletal: exam reveals no obvious joint deformities, tenderness or joint swelling or erythema.   Neurologically:  Mental status: The patient is awake, alert and oriented in all 4 spheres. Her immediate and remote memory, attention, language skills and fund of knowledge are appropriate. There is no evidence of aphasia, agnosia, apraxia or anomia.  Speech is clear with normal prosody and enunciation. Thought process is linear. Mood is normal and affect is normal.  Cranial nerves II - XII are as described above under HEENT exam. In addition: shoulder shrug is normal with equal shoulder height noted.  On 05/27/2021: On Archimedes spiral drawing she has no significant trembling with either hand, with the exception of slight insecurity with the nondominant, left hand.  Handwriting with the right hand is legible, not tremulous, not micrographic. She has no significant upper extremity postural tremor or action tremor or intention tremor, no resting tremor. Motor exam: Normal bulk, strength and tone is noted. There is no drift, tremor or rebound. Romberg is negative. Reflexes are 2+ throughout. Babinski: Toes are flexor bilaterally. Fine motor skills and coordination: intact with normal finger taps, normal hand movements, normal rapid alternating patting, normal foot taps and normal foot agility.  Cerebellar testing: No dysmetria or intention tremor on finger to nose testing. Heel to shin is unremarkable bilaterally. There is no truncal or gait ataxia.  Sensory exam: intact to light touch in the upper and lower extremities.  Gait, station and balance: She stands easily. No veering to one side is noted. No leaning to one side is noted. Posture is age-appropriate and stance is narrow based. Gait shows normal stride length and normal pace. No problems turning are noted. Tandem walk is unremarkable.              Assessment and Plan:   In summary, Jayli Valcarcel is a very pleasant 40 y.o.-year old female with an underlying medical history of Vitamin D deficiency, anxiety, PCOS, and obesity, who presents for evaluation of her head tremor.  She reports a family history of Parkinson's disease on father side as well as a family history of essential tremor on mom side.  Examination shows a subtle head tremor which is side to side.  Tremor is intermittent and  rather mild.  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  She is largely reassured today.  No obvious evidence of parkinsonism.  She is advised regarding typical tremor triggers.  She is advised to stay  well-hydrated, well rested.  She is not overusing any caffeine and she is not using alcohol to treat her tremors.  She is advised to follow-up with you as scheduled and I would be happy to see her back as needed.  I would like to review her recent blood test results that were done through your office in early June, we will request test results from your office.  I do not believe she needs a brain scan, her history is benign, exam otherwise normal. I answered all her questions today and the patient and her mom were in agreement with the plan.  Thank you very much for allowing me to participate in the care of this nice patient. If I can be of any further assistance to you please do not hesitate to call me at [phone].  Sincerely,   Star Age, MD, PhD

## 2021-05-27 NOTE — Patient Instructions (Signed)
You have a rather mild and intermittent head tremor.  Given your family history on mom side of head and hand tremors, you likely have a very mild form of what we call essential tremor which is a hereditary benign tremor disorder.  Reassuringly, I do not see any signs or symptoms of parkinson's like disease or what we call parkinsonism.   For your tremor, I would not recommend any new medication for fear of side effects (especially sleepiness) from symptomatic medication trials.  I would recommend that you continue to monitor your symptoms and try to be mindful of your triggers.  We do not have to make a scheduled follow-up appointment but I would be happy to see you in the future if needed.  We will request blood test results from Dr. Thomasena Edis' office.  We do not have to order a brain scan or additional blood work today.  Your exam overall is benign and reassuring. Please remember, that any kind of tremor may be exacerbated by anxiety, anger, nervousness, excitement, dehydration, sleep deprivation, thyroid dysfunction, by caffeine, and low blood sugar values or blood sugar fluctuations. Some medications can exacerbate tremors, this includes typically certain SSRI type antidepressants.

## 2023-03-22 ENCOUNTER — Other Ambulatory Visit (HOSPITAL_BASED_OUTPATIENT_CLINIC_OR_DEPARTMENT_OTHER): Payer: Self-pay | Admitting: Family Medicine

## 2023-03-22 DIAGNOSIS — Z823 Family history of stroke: Secondary | ICD-10-CM

## 2023-03-22 DIAGNOSIS — E7800 Pure hypercholesterolemia, unspecified: Secondary | ICD-10-CM

## 2023-03-22 DIAGNOSIS — Z8249 Family history of ischemic heart disease and other diseases of the circulatory system: Secondary | ICD-10-CM

## 2023-05-04 ENCOUNTER — Ambulatory Visit (HOSPITAL_BASED_OUTPATIENT_CLINIC_OR_DEPARTMENT_OTHER)
Admission: RE | Admit: 2023-05-04 | Discharge: 2023-05-04 | Disposition: A | Discharge status: Home / Self Care | Payer: Self-pay | Source: Ambulatory Visit | Attending: Family Medicine | Admitting: Family Medicine

## 2023-05-04 DIAGNOSIS — Z823 Family history of stroke: Secondary | ICD-10-CM | POA: Insufficient documentation

## 2023-05-04 DIAGNOSIS — E7800 Pure hypercholesterolemia, unspecified: Secondary | ICD-10-CM | POA: Insufficient documentation

## 2023-05-04 DIAGNOSIS — Z8249 Family history of ischemic heart disease and other diseases of the circulatory system: Secondary | ICD-10-CM | POA: Insufficient documentation

## 2023-05-10 ENCOUNTER — Other Ambulatory Visit (HOSPITAL_BASED_OUTPATIENT_CLINIC_OR_DEPARTMENT_OTHER): Payer: Self-pay

## 2024-05-26 ENCOUNTER — Telehealth: Payer: Self-pay | Admitting: Family Medicine

## 2024-05-26 NOTE — Telephone Encounter (Unsigned)
 Copied from CRM #[id number]. Topic: Appointments - Transfer of Care >> May 26, 2024  2:52 PM Gennette ORN wrote: Pt is requesting to transfer FROM: Dr. Prentiss Pt is requesting to transfer TO: Dr. Prentiss  Reason for requested transfer: because she cam to our facility  It is the responsibility of the team the patient would like to transfer to (Dr. Prentiss) to reach out to the patient if for any reason this transfer is not acceptable.

## 2024-06-23 ENCOUNTER — Telehealth: Payer: Self-pay

## 2024-06-23 NOTE — Telephone Encounter (Signed)
 This RN attempted to contact patient. No answer. LVM.   Copied from CRM [id number]. Topic: Clinical - Prescription Issue >> Jun 23, 2024  2:41 PM Alisha Hansen wrote: Reason for CRM: Patient is calling to refill saralacton spironolactone  I am not showing it at all on list ,but she has only enough till Monday, and its for high blood pressure an swelling,  [facility] [address] , [place] zip [phone]

## 2024-06-27 ENCOUNTER — Telehealth: Payer: Self-pay

## 2024-06-27 NOTE — Telephone Encounter (Signed)
 Called to inform pt that she needed to reach out to current PCP office for med RF. Pt states previous PC office told her she needed to reach out to [facility] due to the provider taking Dr. Nonda place no longer working at that office. I apologized to pt and informed her that a provider in that office would have to RF medication as Dr. Prentiss does not start here until November. Pt states she will reach back out to other office.

## 2024-06-27 NOTE — Telephone Encounter (Signed)
 Copied from CRM [id number]. Topic: Clinical - Medication Question >> Jun 27, 2024 12:59 PM Viola F wrote: Patient called to follow up on message from 06/23/24 regarding the  saralacton spironolactone medication, says she's completely out as of today and really needs it to prevent high blood pressure and swelling - her new patient appt isn't until 08/17/24. Please call her at [phone] (M)

## 2024-08-16 DIAGNOSIS — F419 Anxiety disorder, unspecified: Secondary | ICD-10-CM | POA: Insufficient documentation

## 2024-08-17 ENCOUNTER — Encounter: Payer: Self-pay | Admitting: Family Medicine

## 2024-08-17 ENCOUNTER — Telehealth: Payer: Self-pay | Admitting: Family Medicine

## 2024-08-17 ENCOUNTER — Ambulatory Visit: Payer: Self-pay | Admitting: Family Medicine

## 2024-08-17 VITALS — BP 106/62 | HR 71 | Temp 98.4°F | Ht 62.0 in | Wt 203.2 lb

## 2024-08-17 DIAGNOSIS — F900 Attention-deficit hyperactivity disorder, predominantly inattentive type: Secondary | ICD-10-CM | POA: Diagnosis not present

## 2024-08-17 DIAGNOSIS — L659 Nonscarring hair loss, unspecified: Secondary | ICD-10-CM

## 2024-08-17 DIAGNOSIS — E7800 Pure hypercholesterolemia, unspecified: Secondary | ICD-10-CM

## 2024-08-17 DIAGNOSIS — M6289 Other specified disorders of muscle: Secondary | ICD-10-CM | POA: Diagnosis not present

## 2024-08-17 DIAGNOSIS — E282 Polycystic ovarian syndrome: Secondary | ICD-10-CM

## 2024-08-17 DIAGNOSIS — Z1231 Encounter for screening mammogram for malignant neoplasm of breast: Secondary | ICD-10-CM

## 2024-08-17 DIAGNOSIS — Z79899 Other long term (current) drug therapy: Secondary | ICD-10-CM

## 2024-08-17 DIAGNOSIS — N390 Urinary tract infection, site not specified: Secondary | ICD-10-CM

## 2024-08-17 DIAGNOSIS — Z6837 Body mass index (BMI) 37.0-37.9, adult: Secondary | ICD-10-CM

## 2024-08-17 DIAGNOSIS — R931 Abnormal findings on diagnostic imaging of heart and coronary circulation: Secondary | ICD-10-CM

## 2024-08-17 DIAGNOSIS — E66812 Obesity, class 2: Secondary | ICD-10-CM | POA: Diagnosis not present

## 2024-08-17 DIAGNOSIS — E559 Vitamin D deficiency, unspecified: Secondary | ICD-10-CM | POA: Diagnosis not present

## 2024-08-17 DIAGNOSIS — F411 Generalized anxiety disorder: Secondary | ICD-10-CM | POA: Diagnosis not present

## 2024-08-17 LAB — CBC WITH DIFFERENTIAL/PLATELET
Basophils Absolute: 0.1 K/uL (ref 0.0–0.1)
Basophils Relative: 0.9 % (ref 0.0–3.0)
Eosinophils Absolute: 0.3 K/uL (ref 0.0–0.7)
Eosinophils Relative: 3.3 % (ref 0.0–5.0)
HCT: 42.7 % (ref 36.0–46.0)
Hemoglobin: 14.5 g/dL (ref 12.0–15.0)
Lymphocytes Relative: 37.1 % (ref 12.0–46.0)
Lymphs Abs: 3.8 K/uL (ref 0.7–4.0)
MCHC: 34 g/dL (ref 30.0–36.0)
MCV: 87.7 fl (ref 78.0–100.0)
Monocytes Absolute: 0.7 K/uL (ref 0.1–1.0)
Monocytes Relative: 7 % (ref 3.0–12.0)
Neutro Abs: 5.3 K/uL (ref 1.4–7.7)
Neutrophils Relative %: 51.7 % (ref 43.0–77.0)
Platelets: 435 K/uL — ABNORMAL HIGH (ref 150.0–400.0)
RBC: 4.86 Mil/uL (ref 3.87–5.11)
RDW: 13.5 % (ref 11.5–15.5)
WBC: 10.3 K/uL (ref 4.0–10.5)

## 2024-08-17 LAB — COMPREHENSIVE METABOLIC PANEL WITH GFR
ALT: 14 U/L (ref 0–35)
AST: 18 U/L (ref 0–37)
Albumin: 4.4 g/dL (ref 3.5–5.2)
Alkaline Phosphatase: 46 U/L (ref 39–117)
BUN: 8 mg/dL (ref 6–23)
CO2: 26 meq/L (ref 19–32)
Calcium: 9.1 mg/dL (ref 8.4–10.5)
Chloride: 100 meq/L (ref 96–112)
Creatinine, Ser: 0.62 mg/dL (ref 0.40–1.20)
GFR: 108.92 mL/min (ref >60.00)
Glucose, Bld: 83 mg/dL (ref 70–99)
Potassium: 4.4 meq/L (ref 3.5–5.1)
Sodium: 134 meq/L — ABNORMAL LOW (ref 135–145)
Total Bilirubin: 0.8 mg/dL (ref 0.2–1.2)
Total Protein: 7.7 g/dL (ref 6.0–8.3)

## 2024-08-17 LAB — LIPID PANEL
Cholesterol: 265 mg/dL — ABNORMAL HIGH (ref 0–200)
HDL: 48.2 mg/dL (ref >39.00)
LDL Cholesterol: 182 mg/dL — ABNORMAL HIGH (ref 0–99)
NonHDL: 216.42
Total CHOL/HDL Ratio: 5
Triglycerides: 171 mg/dL — ABNORMAL HIGH (ref 0.0–149.0)
VLDL: 34.2 mg/dL (ref 0.0–40.0)

## 2024-08-17 LAB — TSH: TSH: 1.72 u[IU]/mL (ref 0.35–5.50)

## 2024-08-17 LAB — VITAMIN D 25 HYDROXY (VIT D DEFICIENCY, FRACTURES): VITD: 65.53 ng/mL (ref 30.00–100.00)

## 2024-08-17 LAB — HEMOGLOBIN A1C: Hgb A1c MFr Bld: 4.9 % (ref 4.6–6.5)

## 2024-08-17 LAB — VITAMIN B12: Vitamin B-12: >1500 pg/mL — ABNORMAL HIGH (ref 211–911)

## 2024-08-17 NOTE — Telephone Encounter (Signed)
 I have this pharmacy listed for her medications to be sent

## 2024-08-17 NOTE — Telephone Encounter (Signed)
 Pt would like her meds sent to  [id number] [place], [place] - [address] AT [facility] [address] Phone: [phone]  Fax: [phone]     As she is staying out of town.

## 2024-08-17 NOTE — Progress Notes (Signed)
 Assessment & Plan:   PCOS (polycystic ovarian syndrome) Dx when she was younger by GYN. + for ovarian cysts. Spironolactone has helped with hirsutism.   Class 2 obesity without serious comorbidity with body mass index (BMI) of 37.0 to 37.9 in adult Action stage. Rx Zepbound 10 mg vial but micro-dosing to 5 mg/week. Tolerating medication well. Weight at our last visit was 217, so she has lost 15 pounds over the past 3 months. She is very active but not exercising regularly. I stressed strength training. Rec: 2-3 times per week.   Pelvic floor dysfunction Long-time GYN has recommended a hysterectomy for floppy uterus. We will review records to update with specific Dx as well as complete a physical with PAP at her next visit.   GAD (generalized anxiety disorder) Taking Buspar daily. It is helping. She is open to changing to a different daily medication if needed going forward. She previously noted palpitations/tachycardia when anxiety previously uncontrolled.   Abnormal Heart Score CT 05/04/2023, Cardiac CT: 23 (LAD). 98th%.   Frequent UTI Has Macrobid prn. Consider estrogen topical.  Pure hypercholesterolemia The 10-year ASCVD risk score (Arnett DK, et al., 2019) is: 1%   Values used to calculate the score:     Age: 43 years     Clincally relevant sex: Female     Is Non-Hispanic African American: No     Diabetic: No     Tobacco smoker: No     Systolic Blood Pressure: 106 mmHg     Is BP treated: No     HDL Cholesterol: 48.2 mg/dL     Total Cholesterol: 265 mg/dL   Orders Placed This Encounter  Procedures   MM Digital Screening Breast Bilateral   CBC with Differential/Platelet   Comprehensive metabolic panel with GFR   Hemoglobin A1c   Iron, TIBC and Ferritin Panel   Lipid panel   TSH   Vitamin B12   VITAMIN D  25 Hydroxy (Vit-D Deficiency, Fractures)   Ambulatory referral to Physical Therapy   Medications Discontinued During This Encounter  Medication Reason    busPIRone (BUSPAR) 5 MG tablet    nitrofurantoin (MACRODANTIN) 100 MG capsule Duplicate    Meds ordered this encounter  Medications   Lisdexamfetamine Dimesylate  10 MG CHEW    Sig: Chew 1 tablet (10 mg total) by mouth daily.    Dispense:  30 tablet    Refill:  0     Geni Shutter, DO  [facility] [phone] (phone) [phone] (fax)  Subjective:   Patient is well known to me from my previous clinic and is now establishing care with me as their primary care provider.  All past medical history, surgical history, allergies, family history, immunizations andmedications were updated in the EMR today and reviewed under the history and medication portions of their EMR. Review of Systems: Negative, with the exception of above mentioned in HPI.  Current Outpatient Medications:    Cholecalciferol (VITAMIN D3) 125 MCG (5000 UT) CAPS, 1 capsule., Disp: , Rfl:    Lisdexamfetamine Dimesylate  10 MG CHEW, Chew 1 tablet (10 mg total) by mouth daily., Disp: 30 tablet, Rfl: 0   Magnesium (MAGNESIUM COMPLEX HIGH POTENCY) 250 MG CAPS, 1 capsule., Disp: , Rfl:    nitrofurantoin, macrocrystal-monohydrate, (MACROBID) 100 MG capsule, 1 capsule with food Oral every 12 hrs; Duration: 30 days As needed for UTI prevention, Disp: , Rfl:    spironolactone (ALDACTONE) 50 MG tablet, Take 50 mg by mouth daily., Disp: ,  Rfl:    Tirzepatide-Weight Management (ZEPBOUND) 15 MG/0.5ML SOLN, Inject 15mg  Subcutaneous once weekly; Duration: 30 days, Disp: , Rfl:    busPIRone (BUSPAR) 10 MG tablet, Take 10 mg by mouth daily as needed., Disp: , Rfl:    Objective:   VITALS: BP 106/62 (BP Location: Left Arm, Cuff Size: Normal)   Pulse 71   Temp 98.4 F (36.9 C) (Oral)   Ht 5' 2 (1.575 m)   Wt 203 lb 3.2 oz (92.2 kg)   SpO2 97%   BMI 37.17 kg/m   Wt Readings from Last 3 Encounters:  08/17/24 203 lb 3.2 oz (92.2 kg)  05/27/21 217 lb (98.4 kg)   PHYSICAL EXAM: General: The  patient is well-nourished, in no acute distress. The vital signs are documented above. Cardiac: There is a regular rate and rhythm.  Pulmonary: There is good air exchange bilaterally without wheezing or rales. Psychiatric: The patient has a normal affect.  LABS: Lab Results  Component Value Date   CREATININE 0.62 08/17/2024   BUN 8 08/17/2024   NA 134 (L) 08/17/2024   K 4.4 08/17/2024   CL 100 08/17/2024   CO2 26 08/17/2024   Lab Results  Component Value Date   ALT 14 08/17/2024   AST 18 08/17/2024   ALKPHOS 46 08/17/2024   BILITOT 0.8 08/17/2024   Lab Results  Component Value Date   HGBA1C 4.9 08/17/2024   No results found for: INSULIN Lab Results  Component Value Date   TSH 1.72 08/17/2024   Lab Results  Component Value Date   CHOL 265 (H) 08/17/2024   HDL 48.20 08/17/2024   LDLCALC 182 (H) 08/17/2024   TRIG 171.0 (H) 08/17/2024   CHOLHDL 5 08/17/2024   Lab Results  Component Value Date   VD25OH 65.53 08/17/2024   Lab Results  Component Value Date   WBC 10.3 08/17/2024   HGB 14.5 08/17/2024   HCT 42.7 08/17/2024   MCV 87.7 08/17/2024   PLT 435.0 (H) 08/17/2024   Lab Results  Component Value Date   IRON 179 08/17/2024   TIBC 394 08/17/2024   FERRITIN 16 08/17/2024

## 2024-08-18 LAB — IRON,TIBC AND FERRITIN PANEL
%SAT: 45 % (ref 16–45)
Ferritin: 16 ng/mL (ref 16–232)
Iron: 179 ug/dL (ref 40–190)
TIBC: 394 ug/dL (ref 250–450)

## 2024-08-18 NOTE — Telephone Encounter (Signed)
 Please see the message from the pt below

## 2024-08-19 DIAGNOSIS — F900 Attention-deficit hyperactivity disorder, predominantly inattentive type: Secondary | ICD-10-CM | POA: Insufficient documentation

## 2024-08-19 DIAGNOSIS — L659 Nonscarring hair loss, unspecified: Secondary | ICD-10-CM | POA: Insufficient documentation

## 2024-08-19 DIAGNOSIS — F411 Generalized anxiety disorder: Secondary | ICD-10-CM | POA: Insufficient documentation

## 2024-08-19 DIAGNOSIS — R931 Abnormal findings on diagnostic imaging of heart and coronary circulation: Secondary | ICD-10-CM | POA: Insufficient documentation

## 2024-08-19 DIAGNOSIS — E559 Vitamin D deficiency, unspecified: Secondary | ICD-10-CM | POA: Insufficient documentation

## 2024-08-19 DIAGNOSIS — M6289 Other specified disorders of muscle: Secondary | ICD-10-CM | POA: Insufficient documentation

## 2024-08-19 DIAGNOSIS — E7800 Pure hypercholesterolemia, unspecified: Secondary | ICD-10-CM | POA: Insufficient documentation

## 2024-08-19 DIAGNOSIS — N390 Urinary tract infection, site not specified: Secondary | ICD-10-CM | POA: Insufficient documentation

## 2024-08-19 MED ORDER — LISDEXAMFETAMINE DIMESYLATE 10 MG PO CHEW: 10.0000 mg | CHEWABLE_TABLET | Freq: Every day | ORAL | 0 refills | Status: DC

## 2024-08-19 NOTE — Assessment & Plan Note (Signed)
 Action stage. Rx Zepbound 10 mg vial but micro-dosing to 5 mg/week. Tolerating medication well. Weight at our last visit was 217, so she has lost 15 pounds over the past 3 months. She is very active but not exercising regularly. I stressed strength training. Rec: 2-3 times per week.

## 2024-08-19 NOTE — Assessment & Plan Note (Signed)
 Taking Buspar daily. It is helping. She is open to changing to a different daily medication if needed going forward. She previously noted palpitations/tachycardia when anxiety previously uncontrolled.

## 2024-08-19 NOTE — Assessment & Plan Note (Signed)
 The 10-year ASCVD risk score (Arnett DK, et al., 2019) is: 1%   Values used to calculate the score:     Age: 43 years     Clincally relevant sex: Female     Is Non-Hispanic African American: No     Diabetic: No     Tobacco smoker: No     Systolic Blood Pressure: 106 mmHg     Is BP treated: No     HDL Cholesterol: 48.2 mg/dL     Total Cholesterol: 265 mg/dL

## 2024-08-19 NOTE — Assessment & Plan Note (Signed)
 05/04/2023, Cardiac CT: 23 (LAD). 98th%.

## 2024-08-19 NOTE — Assessment & Plan Note (Signed)
 Has Macrobid prn. Consider estrogen topical.

## 2024-08-19 NOTE — Assessment & Plan Note (Addendum)
 Dx when she was younger by GYN. + for ovarian cysts. Spironolactone has helped with hirsutism.

## 2024-08-19 NOTE — Assessment & Plan Note (Signed)
 Long-time GYN has recommended a hysterectomy for floppy uterus. We will review records to update with specific Dx as well as complete a physical with PAP at her next visit.

## 2024-08-22 ENCOUNTER — Other Ambulatory Visit (HOSPITAL_COMMUNITY): Payer: Self-pay

## 2024-08-22 ENCOUNTER — Telehealth: Payer: Self-pay

## 2024-08-22 NOTE — Telephone Encounter (Signed)
 Pharmacy Patient Advocate Encounter  Insurance verification completed.   The patient is insured through [employer] test claim for Lisdexamfetamine Dimesylate  10 MG CHEW . Currently a quantity of 30 is a 30 day supply and the co-pay is 25.00 . NO P/A NEEDED.      PLEASE NOTE:  No P/A is needed. The medication is out of stock and on backorder meaning the pharmacy cant order the medication. The pt will have to call around to pharmacys around her and se who has the medication. I have called One other pharmacy That the original pharmacy has told me to call that's ([facility] [address] in [place], [place] they dont have it either.           This test claim was processed through [facility]- copay amounts may vary at other pharmacies due to pharmacy/plan contracts, or as the patient moves through the different stages of their insurance plan.

## 2024-08-22 NOTE — Telephone Encounter (Signed)
 Pt reports that her pharmacy is saying her Vyvanse  prescription is denied and possibly needs a prior auth. Can we start a PA for pts Vyvanse  10mg  chewable tablets?

## 2024-09-22 ENCOUNTER — Ambulatory Visit: Admitting: Family Medicine

## 2024-09-22 ENCOUNTER — Other Ambulatory Visit (HOSPITAL_COMMUNITY)
Admission: RE | Admit: 2024-09-22 | Discharge: 2024-09-22 | Disposition: A | Discharge status: Home / Self Care | Source: Ambulatory Visit | Attending: Family Medicine | Admitting: Family Medicine

## 2024-09-22 VITALS — BP 118/68 | HR 68 | Temp 97.7°F | Ht 62.0 in

## 2024-09-22 DIAGNOSIS — L659 Nonscarring hair loss, unspecified: Secondary | ICD-10-CM

## 2024-09-22 DIAGNOSIS — F410 Panic disorder [episodic paroxysmal anxiety] without agoraphobia: Secondary | ICD-10-CM | POA: Diagnosis not present

## 2024-09-22 DIAGNOSIS — B3731 Acute candidiasis of vulva and vagina: Secondary | ICD-10-CM

## 2024-09-22 DIAGNOSIS — Z01419 Encounter for gynecological examination (general) (routine) without abnormal findings: Secondary | ICD-10-CM

## 2024-09-22 DIAGNOSIS — E782 Mixed hyperlipidemia: Secondary | ICD-10-CM | POA: Diagnosis not present

## 2024-09-22 DIAGNOSIS — Z124 Encounter for screening for malignant neoplasm of cervix: Secondary | ICD-10-CM

## 2024-09-22 DIAGNOSIS — Z6837 Body mass index (BMI) 37.0-37.9, adult: Secondary | ICD-10-CM | POA: Diagnosis not present

## 2024-09-22 DIAGNOSIS — E66812 Obesity, class 2: Secondary | ICD-10-CM

## 2024-09-22 DIAGNOSIS — F900 Attention-deficit hyperactivity disorder, predominantly inattentive type: Secondary | ICD-10-CM | POA: Diagnosis not present

## 2024-09-22 NOTE — Progress Notes (Signed)
 "  Patient Care Team: Prentiss Frieze, DO as PCP - General (Family Medicine)  Subjective   Alisha Hansen is a 43 y.o. female presenting for a comprehensive medical examination. She does not have additional problems to discuss today.   Review of Systems: Negative, with the exception of above mentioned in HPI.  History   Reviewed and updated by clinician on day of visit: allergies, medications, problem list, medical history, surgical history, family history, social history, and previous encounter notes.    Medications and Allergies   Show/hide medication list[1] Allergies[2]  Objective   BP 118/68   Pulse 68   Temp 97.7 F (36.5 C) (Temporal)   Ht 5' 2 (1.575 m)   SpO2 98%   BMI 37.17 kg/m    Physical Exam Vitals and nursing note reviewed. Exam conducted with a chaperone present.  HENT:     Head: Normocephalic and atraumatic.  Eyes:     Extraocular Movements: Extraocular movements intact.     Conjunctiva/sclera: Conjunctivae normal.  Cardiovascular:     Rate and Rhythm: Normal rate and regular rhythm.     Pulses: Normal pulses.     Heart sounds: Normal heart sounds.  Pulmonary:     Effort: Pulmonary effort is normal.     Breath sounds: Normal breath sounds.  Abdominal:     Palpations: Abdomen is soft.  Genitourinary:    Vagina: Vaginal discharge present.     Cervix: Normal. No friability.     Uterus: Normal.      Adnexa: Right adnexa normal and left adnexa normal.  Musculoskeletal:     Cervical back: Normal range of motion and neck supple.     Right lower leg: No edema.     Left lower leg: No edema.  Skin:    General: Skin is warm.  Psychiatric:        Behavior: Behavior normal.       Results for orders placed or performed in visit on 08/17/24  CBC with Differential/Platelet   Collection Time: 08/17/24  2:14 PM  Result Value Ref Range   WBC 10.3 4.0 - 10.5 K/uL   RBC 4.86 3.87 - 5.11 Mil/uL   Hemoglobin 14.5 12.0 - 15.0 g/dL   HCT 57.2 63.9 -  53.9 %   MCV 87.7 78.0 - 100.0 fl   MCHC 34.0 30.0 - 36.0 g/dL   RDW 86.4 88.4 - 84.4 %   Platelets 435.0 (H) 150.0 - 400.0 K/uL   Neutrophils Relative % 51.7 43.0 - 77.0 %   Lymphocytes Relative 37.1 12.0 - 46.0 %   Monocytes Relative 7.0 3.0 - 12.0 %   Eosinophils Relative 3.3 0.0 - 5.0 %   Basophils Relative 0.9 0.0 - 3.0 %   Neutro Abs 5.3 1.4 - 7.7 K/uL   Lymphs Abs 3.8 0.7 - 4.0 K/uL   Monocytes Absolute 0.7 0.1 - 1.0 K/uL   Eosinophils Absolute 0.3 0.0 - 0.7 K/uL   Basophils Absolute 0.1 0.0 - 0.1 K/uL  Comprehensive metabolic panel with GFR   Collection Time: 08/17/24  2:14 PM  Result Value Ref Range   Sodium 134 (L) 135 - 145 mEq/L   Potassium 4.4 3.5 - 5.1 mEq/L   Chloride 100 96 - 112 mEq/L   CO2 26 19 - 32 mEq/L   Glucose, Bld 83 70 - 99 mg/dL   BUN 8 6 - 23 mg/dL   Creatinine, Ser 9.37 0.40 - 1.20 mg/dL   Total Bilirubin 0.8 0.2 - 1.2 mg/dL  Alkaline Phosphatase 46 39 - 117 U/L   AST 18 0 - 37 U/L   ALT 14 0 - 35 U/L   Total Protein 7.7 6.0 - 8.3 g/dL   Albumin 4.4 3.5 - 5.2 g/dL   GFR 891.07 >39.99 mL/min   Calcium 9.1 8.4 - 10.5 mg/dL  Hemoglobin J8r   Collection Time: 08/17/24  2:14 PM  Result Value Ref Range   Hgb A1c MFr Bld 4.9 4.6 - 6.5 %  Iron, TIBC and Ferritin Panel   Collection Time: 08/17/24  2:14 PM  Result Value Ref Range   Iron 179 40 - 190 mcg/dL   TIBC 605 749 - 549 mcg/dL (calc)   %SAT 45 16 - 45 % (calc)   Ferritin 16 16 - 232 ng/mL  Lipid panel   Collection Time: 08/17/24  2:14 PM  Result Value Ref Range   Cholesterol 265 (H) 0 - 200 mg/dL   Triglycerides 828.9 (H) 0.0 - 149.0 mg/dL   HDL 51.79 >60.99 mg/dL   VLDL 65.7 0.0 - 59.9 mg/dL   LDL Cholesterol 817 (H) 0 - 99 mg/dL   Total CHOL/HDL Ratio 5    NonHDL 216.42   TSH   Collection Time: 08/17/24  2:14 PM  Result Value Ref Range   TSH 1.72 0.35 - 5.50 uIU/mL  Vitamin B12   Collection Time: 08/17/24  2:14 PM  Result Value Ref Range   Vitamin B-12 >1500 (H) 211 - 911 pg/mL   VITAMIN D  25 Hydroxy (Vit-D Deficiency, Fractures)   Collection Time: 08/17/24  2:14 PM  Result Value Ref Range   VITD 65.53 30.00 - 100.00 ng/mL    Assessment and Plan   Alisha Hansen was seen today for annual exam.  Diagnoses and all orders for this visit:  Well woman exam with routine gynecological exam  Pap smear for cervical cancer screening -     Cytology - PAP  Vaginal yeast infection Comments: After antibiotic use recently. Orders: -     fluconazole  (DIFLUCAN ) 150 MG tablet; Take 1 tablet (150 mg total) by mouth once a week.  Attention deficit hyperactivity disorder (ADHD), predominantly inattentive type Comments: Due for refill. Orders: -     Lisdexamfetamine  Dimesylate 10 MG CHEW; Chew 1 tablet (10 mg total) by mouth daily.  Hair loss Comments: Due for refill. Orders: -     spironolactone  (ALDACTONE ) 50 MG tablet; Take 1 tablet (50 mg total) by mouth daily.  Mixed dyslipidemia Comments: ASCVD 1.3%. Will continue to work on weight loss and lifestyle changes.  Panic attacks -     ALPRAZolam  (XANAX ) 0.25 MG tablet; Take 1 tablet (0.25 mg total) by mouth 2 (two) times daily as needed for anxiety.  Class 2 obesity without serious comorbidity with body mass index (BMI) of 37.0 to 37.9 in adult, unspecified obesity type -     Tirzepatide -Weight Management (ZEPBOUND ) 15 MG/0.5ML SOLN; Inject 15 mg into the skin once a week.   Health Maintenance   Immunization History  Administered Date(s) Administered   Influenza,inj,Quad PF,6+ Mos 07/30/2015   Influenza-Unspecified 07/30/2015   Health Maintenance  Topic Date Due   HIV Screening  Never done   Hepatitis C Screening  Never done   DTaP/Tdap/Td (1 - Tdap) Never done   Hepatitis B Vaccines 19-59 Average Risk (1 of 3 - 19+ 3-dose series) Never done   HPV VACCINES (1 - 3-dose SCDM series) Never done   Cervical Cancer Screening (HPV/Pap Cotest)  11/02/2015   Mammogram  Never done   COVID-19 Vaccine (1) 10/08/2024  (Originally 07/07/1981)   Influenza Vaccine  12/26/2024 (Originally 04/28/2024)   Pneumococcal Vaccine  Aged Out   Meningococcal B Vaccine  Aged Out   Screening       09/22/2024    8:47 AM 08/17/2024    1:02 PM  Depression screen PHQ 2/9  Decreased Interest 0 0  Down, Depressed, Hopeless 1 0  PHQ - 2 Score 1 0  Altered sleeping 1 0  Tired, decreased energy 1 1  Change in appetite 0 0  Feeling bad or failure about yourself  1 0  Trouble concentrating 0 0  Moving slowly or fidgety/restless 0 0  Suicidal thoughts 0 0  PHQ-9 Score 4 1  Difficult doing work/chores Not difficult at all Not difficult at all      09/22/2024    8:47 AM 08/17/2024    1:02 PM  Fall Risk   Falls in the past year? 0 0  Number falls in past yr: 0   Injury with Fall? 0   Risk for fall due to : No Fall Risks   Follow up Falls evaluation completed    Preventive Health Recommendations   Sexual Health  Use condoms consistently to reduce STI risk  Choose partners carefully  Use effective contraception to prevent unintended pregnancy  Learn more: savingpics.uy  Tobacco & Nicotine  Avoid smoking and all tobacco or nicotine products  Quit support available: https://smokefree.gov  Alcohol  Best option is no alcohol  If drinking, limit to <=7 drinks/week and <=3 drinks/occasion (women)  Higher intake increases health risks  Learn more: http://www.green.com/  Substance Use  Avoid illicit drug use  Confidential treatment resources available if needed  Help: identitylist.se  Nutrition & Weight Health (Obesity Medicine-Based)  Follow a whole-food, nutrient-dense eating pattern  Prioritize adequate protein to support muscle and metabolism  Emphasize vegetables, fruits, lean proteins, legumes, and healthy fats  Limit ultra-processed foods, added sugars, sugary beverages, and refined carbohydrates  Choose mostly unsaturated fats; limit saturated fats  Aim to  meet fiber, calcium, potassium, and micronutrient needs through food  No single best diet--Mediterranean, DASH, and plant-forward patterns can all be effective when individualized  Learn more: tonerproviders.com.cy  Physical Activity  Engage in regular physical activity appropriate for your abilities  Include aerobic activity, strength training, and flexibility  Progress gradually and focus on consistency  Exercise guidance: https://www.acsm.org/education-resources/trending-topics-resources/physical-activity-guidelines  Injury & Home Safety  Use seat belts and appropriate helmets  Maintain working smoke detectors  Avoid smoking near bedding or upholstered furniture  Oral Health  Brush teeth twice daily and floss regularly  Maintain routine dental visits  Learn more: carpetlickers.tn  Follow-Up  Next preventive physical recommended in 1 year  Alisha Shutter, DO, MS, FAAFP, Dipl. [facility] [address] [place], [address] Dept: [phone] Dept Fax: [phone]     [1]  Outpatient Medications Prior to Visit  Medication Sig   Cholecalciferol (VITAMIN D3) 125 MCG (5000 UT) CAPS 1 capsule.   Magnesium (MAGNESIUM COMPLEX HIGH POTENCY) 250 MG CAPS 1 capsule.   [DISCONTINUED] busPIRone (BUSPAR) 10 MG tablet Take 10 mg by mouth daily as needed.   [DISCONTINUED] nitrofurantoin, macrocrystal-monohydrate, (MACROBID) 100 MG capsule 1 capsule with food Oral every 12 hrs; Duration: 30 days As needed for UTI prevention   [DISCONTINUED] Lisdexamfetamine  Dimesylate 10 MG CHEW Chew 1 tablet (10 mg total) by mouth daily.   [DISCONTINUED] spironolactone  (ALDACTONE ) 50 MG tablet Take  50 mg by mouth daily.   [DISCONTINUED] Tirzepatide -Weight Management (ZEPBOUND ) 15 MG/0.5ML SOLN Inject 15mg  Subcutaneous once weekly; Duration: 30 days   No facility-administered medications prior to visit.  [2]   Allergies Allergen Reactions   Propranolol Hcl     Other Reaction(s): Lethargy, fatigue, feeling unwell   "

## 2024-09-23 ENCOUNTER — Encounter: Payer: Self-pay | Admitting: Family Medicine

## 2024-09-23 DIAGNOSIS — E782 Mixed hyperlipidemia: Secondary | ICD-10-CM | POA: Insufficient documentation

## 2024-09-23 MED ORDER — LISDEXAMFETAMINE DIMESYLATE 10 MG PO CHEW: 10.0000 mg | CHEWABLE_TABLET | Freq: Every day | ORAL | 0 refills | Status: DC

## 2024-09-23 MED ORDER — SPIRONOLACTONE 50 MG PO TABS: 50.0000 mg | ORAL_TABLET | Freq: Every day | ORAL | 2 refills | Status: AC

## 2024-09-23 MED ORDER — ZEPBOUND 15 MG/0.5ML ~~LOC~~ SOLN: 15.0000 mg | SUBCUTANEOUS | 11 refills | Status: AC

## 2024-09-23 MED ORDER — ALPRAZOLAM 0.25 MG PO TABS: 0.2500 mg | ORAL_TABLET | Freq: Two times a day (BID) | ORAL | 0 refills | Status: AC | PRN

## 2024-09-23 MED ORDER — FLUCONAZOLE 150 MG PO TABS: 150.0000 mg | ORAL_TABLET | ORAL | 0 refills | Status: AC

## 2024-09-26 LAB — CYTOLOGY - PAP
Adequacy: ABSENT
Chlamydia: NEGATIVE
Comment: NEGATIVE
Comment: NEGATIVE
Comment: NEGATIVE
Comment: NORMAL
Diagnosis: NEGATIVE
High risk HPV: NEGATIVE
Neisseria Gonorrhea: NEGATIVE
Trichomonas: NEGATIVE

## 2024-10-03 DIAGNOSIS — F900 Attention-deficit hyperactivity disorder, predominantly inattentive type: Secondary | ICD-10-CM

## 2024-10-03 NOTE — Telephone Encounter (Signed)
 Unfortunately we cannot [place]  [place]. I will send her a message letting her know that. Please see the rest of pts message.

## 2024-10-11 NOTE — Telephone Encounter (Signed)
 I have sent a message to pt with some follow up questions and suggestions per Dr Prentiss. I hav changed pts pharmacy to the [facility] is [place].

## 2024-10-20 NOTE — Telephone Encounter (Signed)
 Dr Prentiss, I will take are of notifying pt about other mail in pharmacies, As I am pretty sure [facility] will not fill controled meds. Pt would like to try switching to adderall XR. I have d/c'd Vvyanse from her med list. **What dose do you want to send?

## 2024-10-21 MED ORDER — AMPHETAMINE-DEXTROAMPHET ER 5 MG PO CP24: 5.0000 mg | ORAL_CAPSULE | Freq: Every day | ORAL | 0 refills | Status: AC

## 2024-10-21 NOTE — Addendum Note (Signed)
 Addended by: PRENTISS FRIEZE R on: 10/21/2024 08:55 AM   Modules accepted: Orders

## 2024-10-23 NOTE — Telephone Encounter (Signed)
 We have had some issues in the past with being able to send controled meds to [facility]. They have said previously that they do not fill stimulants. I have sent a message to pt asking her what other mail order pharmacy she wants to use.
